# Patient Record
Sex: Male | Born: 1960 | Race: White | Hispanic: No | Smoking: Current every day smoker
Health system: Southern US, Community
[De-identification: ages and names within clinical notes are randomized; demographics above are authoritative.]

## PROBLEM LIST (undated history)

## (undated) ENCOUNTER — Emergency Department (HOSPITAL_COMMUNITY): Admission: EM | Discharge status: Absent without leave | Payer: Self-pay

## (undated) DIAGNOSIS — R569 Unspecified convulsions: Secondary | ICD-10-CM

## (undated) DIAGNOSIS — F1010 Alcohol abuse, uncomplicated: Secondary | ICD-10-CM

## (undated) DIAGNOSIS — F17200 Nicotine dependence, unspecified, uncomplicated: Secondary | ICD-10-CM

## (undated) DIAGNOSIS — I10 Essential (primary) hypertension: Secondary | ICD-10-CM

## (undated) HISTORY — PX: APPENDECTOMY: SHX54

## (undated) HISTORY — DX: Unspecified convulsions: R56.9

---

## 2012-04-02 HISTORY — PX: REPAIR OF ESOPHAGUS: SHX6062

## 2015-09-06 ENCOUNTER — Emergency Department (HOSPITAL_COMMUNITY)
Admission: EM | Admit: 2015-09-06 | Discharge: 2015-09-06 | Disposition: A | Discharge status: Home / Self Care | Payer: Self-pay | Attending: Emergency Medicine | Admitting: Emergency Medicine

## 2015-09-06 ENCOUNTER — Emergency Department (HOSPITAL_COMMUNITY): Payer: Self-pay

## 2015-09-06 ENCOUNTER — Encounter (HOSPITAL_COMMUNITY): Payer: Self-pay

## 2015-09-06 DIAGNOSIS — Y998 Other external cause status: Secondary | ICD-10-CM | POA: Insufficient documentation

## 2015-09-06 DIAGNOSIS — F17210 Nicotine dependence, cigarettes, uncomplicated: Secondary | ICD-10-CM | POA: Insufficient documentation

## 2015-09-06 DIAGNOSIS — R569 Unspecified convulsions: Secondary | ICD-10-CM | POA: Insufficient documentation

## 2015-09-06 DIAGNOSIS — Y9301 Activity, walking, marching and hiking: Secondary | ICD-10-CM | POA: Insufficient documentation

## 2015-09-06 DIAGNOSIS — Z880 Allergy status to penicillin: Secondary | ICD-10-CM | POA: Insufficient documentation

## 2015-09-06 DIAGNOSIS — W1839XA Other fall on same level, initial encounter: Secondary | ICD-10-CM | POA: Insufficient documentation

## 2015-09-06 DIAGNOSIS — Y92009 Unspecified place in unspecified non-institutional (private) residence as the place of occurrence of the external cause: Secondary | ICD-10-CM | POA: Insufficient documentation

## 2015-09-06 DIAGNOSIS — S0990XA Unspecified injury of head, initial encounter: Secondary | ICD-10-CM | POA: Insufficient documentation

## 2015-09-06 DIAGNOSIS — I10 Essential (primary) hypertension: Secondary | ICD-10-CM | POA: Insufficient documentation

## 2015-09-06 HISTORY — DX: Essential (primary) hypertension: I10

## 2015-09-06 LAB — COMPREHENSIVE METABOLIC PANEL
ALBUMIN: 3.2 g/dL — AB (ref 3.5–5.0)
ALK PHOS: 123 U/L (ref 38–126)
ALT: 53 U/L (ref 17–63)
AST: 198 U/L — ABNORMAL HIGH (ref 15–41)
Anion gap: 10 (ref 5–15)
BUN: <5 mg/dL — ABNORMAL LOW (ref 6–20)
CALCIUM: 8.7 mg/dL — AB (ref 8.9–10.3)
CO2: 31 mmol/L (ref 22–32)
CREATININE: 0.89 mg/dL (ref 0.61–1.24)
Chloride: 93 mmol/L — ABNORMAL LOW (ref 101–111)
GFR calc non Af Amer: >60 mL/min (ref >60)
GLUCOSE: 112 mg/dL — AB (ref 65–99)
Potassium: 2.7 mmol/L — CL (ref 3.5–5.1)
SODIUM: 134 mmol/L — AB (ref 135–145)
Total Bilirubin: 1.3 mg/dL — ABNORMAL HIGH (ref 0.3–1.2)
Total Protein: 6.3 g/dL — ABNORMAL LOW (ref 6.5–8.1)

## 2015-09-06 LAB — CBC WITH DIFFERENTIAL/PLATELET
Basophils Absolute: 0 10*3/uL (ref 0.0–0.1)
Basophils Relative: 0 %
EOS ABS: 0 10*3/uL (ref 0.0–0.7)
Eosinophils Relative: 0 %
HEMATOCRIT: 34.5 % — AB (ref 39.0–52.0)
HEMOGLOBIN: 11.7 g/dL — AB (ref 13.0–17.0)
LYMPHS ABS: 0.5 10*3/uL — AB (ref 0.7–4.0)
LYMPHS PCT: 8 %
MCH: 37.9 pg — AB (ref 26.0–34.0)
MCHC: 33.9 g/dL (ref 30.0–36.0)
MCV: 111.7 fL — ABNORMAL HIGH (ref 78.0–100.0)
Monocytes Absolute: 0.6 10*3/uL (ref 0.1–1.0)
Monocytes Relative: 9 %
NEUTROS ABS: 5.7 10*3/uL (ref 1.7–7.7)
NEUTROS PCT: 83 %
Platelets: 95 10*3/uL — ABNORMAL LOW (ref 150–400)
RBC: 3.09 MIL/uL — AB (ref 4.22–5.81)
RDW: 15.3 % (ref 11.5–15.5)
WBC: 6.8 10*3/uL (ref 4.0–10.5)

## 2015-09-06 LAB — ETHANOL: Alcohol, Ethyl (B): <5 mg/dL (ref <5)

## 2015-09-06 LAB — RAPID URINE DRUG SCREEN, HOSP PERFORMED
Amphetamines: NOT DETECTED
Barbiturates: NOT DETECTED
Benzodiazepines: NOT DETECTED
Cocaine: NOT DETECTED
OPIATES: NOT DETECTED
TETRAHYDROCANNABINOL: NOT DETECTED

## 2015-09-06 LAB — CBG MONITORING, ED: Glucose-Capillary: 126 mg/dL — ABNORMAL HIGH (ref 65–99)

## 2015-09-06 MED ORDER — POTASSIUM CHLORIDE CRYS ER 20 MEQ PO TBCR
40.0000 meq | EXTENDED_RELEASE_TABLET | Freq: Once | ORAL | Status: AC
  Administered 2015-09-06: 40 meq via ORAL
  Filled 2015-09-06: qty 2

## 2015-09-06 MED ORDER — POTASSIUM CHLORIDE ER 20 MEQ PO TBCR: 40.0000 meq | EXTENDED_RELEASE_TABLET | Freq: Every day | ORAL | Status: DC

## 2015-09-06 MED ORDER — SODIUM CHLORIDE 0.9 % IV SOLN
1000.0000 mL | Freq: Once | INTRAVENOUS | Status: AC
  Administered 2015-09-06: 1000 mL via INTRAVENOUS

## 2015-09-06 MED ORDER — SODIUM CHLORIDE 0.9 % IV SOLN
1000.0000 mL | INTRAVENOUS | Status: DC
  Administered 2015-09-06: 1000 mL via INTRAVENOUS

## 2015-09-06 MED ORDER — POTASSIUM CHLORIDE 10 MEQ/100ML IV SOLN
10.0000 meq | Freq: Once | INTRAVENOUS | Status: AC
  Administered 2015-09-06: 10 meq via INTRAVENOUS
  Filled 2015-09-06: qty 100

## 2015-09-06 NOTE — ED Provider Notes (Signed)
CSN: [id number]     Arrival date & time 09/06/15  1608 History   First MD Initiated Contact with Patient 09/06/15 1649     Chief Complaint  Patient presents with  . Seizures   Patient is a 55 y.o. male presenting with seizures.  Seizures Seizure activity on arrival: no   Seizure type:  Tonic Preceding symptoms: no dizziness, no euphoria, no headache, no nausea, no numbness, no panic and no vision change   Initial focality:  None Episode characteristics: abnormal movements, confusion, generalized shaking and tongue biting   Episode characteristics: no apnea and no focal shaking   Postictal symptoms: confusion and somnolence   Return to baseline: yes   Severity:  Moderate Duration:  5 minutes Timing:  Once Number of seizures this episode:  1-2 Progression:  Resolved Context: medical non-compliance   Context: not alcohol withdrawal, not drug use, not family hx of seizures, not fever and not previous head injury   Recent head injury:  During the event PTA treatment:  None History of seizures: yes    Past Medical History  Diagnosis Date  . Hypertension    Past Surgical History  Procedure Laterality Date  . Appendectomy    . Repair of esophagus  2014   History reviewed. No pertinent family history. Social History  Substance Use Topics  . Smoking status: Current Every Day Smoker    Types: Cigarettes  . Smokeless tobacco: None  . Alcohol Use: Yes     Comment: use to be heavy drinker    Review of Systems  Allergic/Immunologic: Negative for immunocompromised state.  Neurological: Positive for seizures.  All other systems reviewed and are negative.     Allergies  Penicillins  Home Medications   Prior to Admission medications   Medication Sig Start Date End Date Taking? Authorizing Provider  potassium chloride 20 MEQ TBCR Take 40 mEq by mouth daily. 09/06/15   Sidney AceAlison Charruf Tedd Cottrill, MD   BP 141/101 mmHg  Pulse 107  Temp(Src) 98.8 F (37.1 C) (Oral)  Resp 15  Ht 6'  2" (1.88 m)  Wt 77.111 kg  BMI 21.82 kg/m2  SpO2 97% Physical Exam  Constitutional: He is oriented to person, place, and time. He appears well-developed and well-nourished. No distress.  HENT:  Head: Normocephalic.  Left Ear: External ear normal.  Eyes: Conjunctivae are normal. Pupils are equal, round, and reactive to light. Right eye exhibits no discharge. Left eye exhibits no discharge.  Neck: Normal range of motion. Neck supple.  Cardiovascular: Normal rate and regular rhythm.   No murmur heard. Pulmonary/Chest: Effort normal and breath sounds normal. No respiratory distress.  Abdominal: Soft. Bowel sounds are normal. He exhibits no distension and no mass. There is no tenderness. There is no rebound and no guarding.  Musculoskeletal: He exhibits no edema.  Neurological: He is alert and oriented to person, place, and time. No cranial nerve deficit. Coordination normal.  Skin: Skin is warm. He is not diaphoretic.  Psychiatric: He has a normal mood and affect.  Out of 5 strength in all extremities, normal sensation 4, normal coordination, does have mild tremor, normal gait Nose is swollen and tender but no deformity, no nasal hematoma, tympanic membrane without blood and open wounds. No tenderness of face otherwise, scalp, CT or L-spine, extremities, thorax or chest.  ED Course  Procedures  Labs Review Labs Reviewed  CBC WITH DIFFERENTIAL/PLATELET - Abnormal; Notable for the following:    RBC 3.09 (*)    Hemoglobin 11.7 (*)  HCT 34.5 (*)    MCV 111.7 (*)    MCH 37.9 (*)    Platelets 95 (*)    Lymphs Abs 0.5 (*)    All other components within normal limits  COMPREHENSIVE METABOLIC PANEL - Abnormal; Notable for the following:    Sodium 134 (*)    Potassium 2.7 (*)    Chloride 93 (*)    Glucose, Bld 112 (*)    BUN <5 (*)    Calcium 8.7 (*)    Total Protein 6.3 (*)    Albumin 3.2 (*)    AST 198 (*)    Total Bilirubin 1.3 (*)    All other components within normal limits   CBG MONITORING, ED - Abnormal; Notable for the following:    Glucose-Capillary 126 (*)    All other components within normal limits  URINE RAPID DRUG SCREEN, HOSP PERFORMED  ETHANOL    Imaging Review Ct Head Wo Contrast  09/06/2015  CLINICAL DATA:  Recent seizure activity EXAM: CT HEAD WITHOUT CONTRAST TECHNIQUE: Contiguous axial images were obtained from the base of the skull through the vertex without intravenous contrast. COMPARISON:  None. FINDINGS: Bony calvarium is intact. Mild atrophic changes are seen. No findings to suggest acute hemorrhage, acute infarction or space-occupying mass lesion are noted. IMPRESSION: Mild atrophic changes without acute abnormality. Electronically Signed   By: Alcide Clever M.D.   On: 09/06/2015 18:39   I have personally reviewed and evaluated these images and lab results as part of my medical decision-making.   EKG Interpretation   Date/Time:  Tuesday September 06 2015 16:12:47 EDT Ventricular Rate:  104 PR Interval:  184 QRS Duration: 92 QT Interval:  342 QTC Calculation: 450 R Axis:   74 Text Interpretation:  Sinus tachycardia Atrial premature complex No old  tracing to compare Confirmed by [facility]  MD, DAVID ([id number]) on 09/06/2015  4:23:49 PM      MDM   Final diagnoses:  Seizure [facility])   Patient presents with first seizure. Initially denied any alcohol use but family states they suspect he continues to drink daily as he disappears for long periods of time. Patient interviewed and private did not but subsequently didn't state that he intermittently drank 4 days ago. However he continues to deny daily use. Ethanol is negative here, UDS negative. CT head negative. No evidence of trauma except that the nose but no deviation, difficulty breathing, or nasal hematoma. No obvious laceration that requires Tdap update. Patient's electrolytes unremarkable Except for potassium which was replaced as an IV and oral and one tablet sent home for patient to take. His  labs are concerning for chronic alcohol use. Patient the past has been on a antiseizure medication but he cannot recall the name. Given questionable alcohol abuse versus primary seizure disorder, patient likely requires further evaluation by neurology. We will not start Keppra at this time. Of note, patient does not hear to be acutely withdrawing or in DTs, not tachycardic anymore, no diaphoresis, no nausea or vomiting. Patient states to follow-up as outpatient for further nonemergent evaluation.    Sidney Ace, MD 09/07/15 1914  Dione Booze, MD 09/07/15 [phone]

## 2015-09-06 NOTE — ED Notes (Signed)
Pt. Coming from home via [facility] for a seizure. Pt. At home with family and started tremoring while walking. Pt. Went into a seizure and fell face first on a carpet in the house. Pt. Hx of one seizure before from ETOH withdrawal. Pt. sts he hasn't  Had a drink in 4-6 months. Pt. Does report a lot of stress from divorce recently. Pt. Post ictal when EMS arrived, but is now AOx4. Pt. Has abrasions noted to right knee, left hand, and nose. Pt. Given 4 mg zofran en route for nausea.

## 2015-10-11 ENCOUNTER — Encounter (HOSPITAL_COMMUNITY): Payer: Self-pay | Admitting: *Deleted

## 2015-10-11 ENCOUNTER — Emergency Department (HOSPITAL_COMMUNITY)
Admission: EM | Admit: 2015-10-11 | Discharge: 2015-10-11 | Disposition: A | Discharge status: Home / Self Care | Payer: Self-pay | Attending: Emergency Medicine | Admitting: Emergency Medicine

## 2015-10-11 DIAGNOSIS — R569 Unspecified convulsions: Secondary | ICD-10-CM

## 2015-10-11 DIAGNOSIS — F17210 Nicotine dependence, cigarettes, uncomplicated: Secondary | ICD-10-CM | POA: Insufficient documentation

## 2015-10-11 DIAGNOSIS — Z79899 Other long term (current) drug therapy: Secondary | ICD-10-CM | POA: Insufficient documentation

## 2015-10-11 DIAGNOSIS — I10 Essential (primary) hypertension: Secondary | ICD-10-CM | POA: Insufficient documentation

## 2015-10-11 DIAGNOSIS — F1010 Alcohol abuse, uncomplicated: Secondary | ICD-10-CM | POA: Insufficient documentation

## 2015-10-11 LAB — CBC WITH DIFFERENTIAL/PLATELET
Basophils Absolute: 0.1 10*3/uL (ref 0.0–0.1)
Basophils Relative: 1 %
Eosinophils Absolute: 0.1 10*3/uL (ref 0.0–0.7)
Eosinophils Relative: 2 %
HEMATOCRIT: 41.4 % (ref 39.0–52.0)
HEMOGLOBIN: 14.1 g/dL (ref 13.0–17.0)
Lymphocytes Relative: 38 %
Lymphs Abs: 2.2 10*3/uL (ref 0.7–4.0)
MCH: 36.2 pg — AB (ref 26.0–34.0)
MCHC: 34.1 g/dL (ref 30.0–36.0)
MCV: 106.4 fL — AB (ref 78.0–100.0)
MONO ABS: 0.3 10*3/uL (ref 0.1–1.0)
Monocytes Relative: 6 %
NEUTROS ABS: 3.1 10*3/uL (ref 1.7–7.7)
NEUTROS PCT: 53 %
Platelets: 217 10*3/uL (ref 150–400)
RBC: 3.89 MIL/uL — AB (ref 4.22–5.81)
RDW: 16 % — ABNORMAL HIGH (ref 11.5–15.5)
WBC: 5.7 10*3/uL (ref 4.0–10.5)

## 2015-10-11 LAB — BASIC METABOLIC PANEL
Anion gap: 11 (ref 5–15)
BUN: 7 mg/dL (ref 6–20)
CHLORIDE: 108 mmol/L (ref 101–111)
CO2: 23 mmol/L (ref 22–32)
CREATININE: 0.85 mg/dL (ref 0.61–1.24)
Calcium: 8.6 mg/dL — ABNORMAL LOW (ref 8.9–10.3)
GFR calc Af Amer: >60 mL/min (ref >60)
GFR calc non Af Amer: >60 mL/min (ref >60)
Glucose, Bld: 112 mg/dL — ABNORMAL HIGH (ref 65–99)
POTASSIUM: 3.5 mmol/L (ref 3.5–5.1)
SODIUM: 142 mmol/L (ref 135–145)

## 2015-10-11 LAB — MAGNESIUM: Magnesium: 1.9 mg/dL (ref 1.7–2.4)

## 2015-10-11 LAB — ETHANOL: ALCOHOL ETHYL (B): 423 mg/dL — AB (ref <5)

## 2015-10-11 MED ORDER — LORAZEPAM 1 MG PO TABS: ORAL_TABLET | ORAL | Status: DC

## 2015-10-11 MED ORDER — SODIUM CHLORIDE 0.9 % IV BOLUS (SEPSIS)
1000.0000 mL | Freq: Once | INTRAVENOUS | Status: AC
  Administered 2015-10-11: 1000 mL via INTRAVENOUS

## 2015-10-11 MED ORDER — SODIUM CHLORIDE 0.9 % IV SOLN
1000.0000 mg | Freq: Once | INTRAVENOUS | Status: AC
  Administered 2015-10-11: 1000 mg via INTRAVENOUS
  Filled 2015-10-11: qty 10

## 2015-10-11 MED ORDER — LEVETIRACETAM 500 MG PO TABS: 500.0000 mg | ORAL_TABLET | Freq: Two times a day (BID) | ORAL | Status: DC

## 2015-10-11 NOTE — Discharge Instructions (Signed)
Seizure, Adult A seizure means there is unusual activity in the brain. A seizure can cause changes in attention or behavior. Seizures often cause shaking (convulsions). Seizures often last from 30 seconds to 2 minutes. HOME CARE   If you are given medicines, take them exactly as told by your doctor.  Keep all doctor visits as told.  Do not swim or drive until your doctor says it is okay.  Teach others what to do if you have a seizure. They should:  Lay you on the ground.  Put a cushion under your head.  Loosen any tight clothing around your neck.  Turn you on your side.  Stay with you until you get better. GET HELP RIGHT AWAY IF:   The seizure lasts longer than 2 to 5 minutes.  The seizure is very bad.  The person does not wake up after the seizure.  The person's attention or behavior changes. Drive the person to the emergency room or call your local emergency services (911 in [place] MAKE SURE YOU:   Understand these instructions.  Will watch your condition.  Will get help right away if you are not doing well or get worse.   This information is not intended to replace advice given to you by your health care provider. Make sure you discuss any questions you have with your health care provider.   Document Released: 09/05/2007 Document Revised: 06/11/2011 Document Reviewed: 10/29/2012     Substance Abuse Treatment Programs  Intensive Outpatient Programs [facility] Services     [address]      [place], [place]                   [phone]       [facility] [address] [place], [place] [phone]  [facility]     (Inpatient and outpatient)     [address]           [phone]    [facility] [phone] (Suboxone and Methadone)  [address]      [place], [place] [address]      [phone]       [address] [place],  [place] [phone]  [facility] (Outpatient/Inpatient, [facility])    (insurance only) [phone]             [facility] (Groups & Residential) [place], [place] [phone]     [facility]     [address]     [place], [place]      [phone]       [facility] (for caregivers and family) [phone] [address] [place], [place] [phone]      Residential Treatment Programs [facility]      [address] [place], [place] [address]  [phone]       [facility] [address], [place], [place] [address] [phone]  [facility] [place]        [phone]       [facility] [phone]  [facility] ([facility]             [address]                                         [place], [place]  [phone] or [phone]                               [facility] ([facility] [address] [place], [phone]  [facility]    [address]      [place], [place]     [phone]       [facility] [address] [place], [place] [phone]  [facility]   [address]     [place], [place] [address]     [phone]      Admissions: 8am-3pm M-F  [facility] ([facility]) [address] [place], [place] [phone]  [facility]: Residential Program [phone](90 Days)   [place]      [phone] or [phone]     [facility]: [facility] [place], [place] (Walk in Hours over the weekend or by referral)  [facility] [address] [place], [place], [place] [address] [phone]  Crisis Mobile: [facility]:    [phone] (for crisis response 24 hours a day) [facility] Hotline:      [phone]

## 2015-10-11 NOTE — ED Provider Notes (Signed)
  Blood pressure 116/87, pulse 88, temperature 98.7 F (37.1 C), temperature source Oral, resp. rate 18, SpO2 95 %.  Assuming care from Dr. Radford PaxBeaton.  In short, Karl LimerickSteven A Alvarado is a 55 y.o. male with a chief complaint of Seizures and Alcohol Intoxication .  Refer to the original H&P for additional details.  The current plan of care is to discharge when clinically sober and ambulatory.   10:16 PM Spoke with patient who awakens easily. He is speaking in a normal tone of voice. Appears clinically sober. Will ambulate and discharge. Reviewed the plan and he is comfortable with this.   Alona BeneJoshua Rehema Muffley, MD  Maia PlanJoshua G Zayyan Mullen, MD 10/11/15 2217

## 2015-10-11 NOTE — ED Notes (Signed)
Per EMS report: pt coming from and presents to the ED after a witnessed seizure.  Pt was drinking ETOH with friends and family when pt had a two minute full body seizure.  Pt hx of seizures and is noncompliant with medications.  EMS didn't note any obvious injuries.  No tongue injury or incontinence.

## 2015-10-11 NOTE — ED Notes (Signed)
Pt ambulated in hallway Pt was a little shaky but stayed on his feet

## 2015-10-11 NOTE — ED Provider Notes (Signed)
CSN: [id number]     Arrival date & time 10/11/15  1423 History   First MD Initiated Contact with Patient 10/11/15 1443     Chief Complaint  Patient presents with  . Seizures  . Alcohol Intoxication      HPI  Expand All Collapse All   Per EMS report: pt coming from and presents to the ED after a witnessed seizure. Pt was drinking ETOH with friends and family when pt had a two minute full body seizure. Pt hx of seizures and is noncompliant with medications. EMS didn't note any obvious injuries. No tongue injury or incontinence.        Past Medical History  Diagnosis Date  . Hypertension    Past Surgical History  Procedure Laterality Date  . Appendectomy    . Repair of esophagus  2014   No family history on file. Social History  Substance Use Topics  . Smoking status: Current Every Day Smoker    Types: Cigarettes  . Smokeless tobacco: None  . Alcohol Use: Yes     Comment: use to be heavy drinker    Review of Systems  Unable to perform ROS: Other      Allergies  Penicillins  Home Medications   Prior to Admission medications   Medication Sig Start Date End Date Taking? Authorizing Provider  diphenhydramine-acetaminophen (TYLENOL PM) 25-500 MG TABS tablet Take 1 tablet by mouth at bedtime as needed (sleep).   Yes Historical Provider, MD  levETIRAcetam (KEPPRA) 500 MG tablet Take 1 tablet (500 mg total) by mouth 2 (two) times daily. 10/11/15   Nelva Nayobert Regine Christian, MD  LORazepam (ATIVAN) 1 MG tablet Take 1 mg every 4 hours as needed for withdrawal 10/11/15   Nelva Nayobert Lovelace Cerveny, MD  potassium chloride 20 MEQ TBCR Take 40 mEq by mouth daily. Patient not taking: Reported on 10/11/2015 09/06/15   Sidney AceAlison Charruf Ruch, MD   BP 125/92 mmHg  Pulse 97  Temp(Src) 98.7 F (37.1 C) (Oral)  Resp 18  SpO2 97% Physical Exam Physical Exam  Nursing note and vitals reviewed. Constitutional: Patient is intoxicated and is slurring his words.  Denies any pain to neck or extremities.  Denies  headache.  well-nourished. No distress.  HENT:  Head: Normocephalic and atraumatic.  Eyes: Pupils are equal, round, and reactive to light.  Neck: Normal range of motion.  Cardiovascular: Normal rate and intact distal pulses.   Pulmonary/Chest: No respiratory distress.  Abdominal: Normal appearance. He exhibits no distension.  Musculoskeletal: Normal range of motion.  Neurological: He is alert and oriented to person, place, and time. No cranial nerve deficit.  Skin: Skin is warm and dry. No rash noted.  Psychiatric: He has a normal mood and affect. His behavior is normal.   ED Course  Procedures (including critical care time) Labs Review Labs Reviewed  CBC WITH DIFFERENTIAL/PLATELET - Abnormal; Notable for the following:    RBC 3.89 (*)    MCV 106.4 (*)    MCH 36.2 (*)    RDW 16.0 (*)    All other components within normal limits  BASIC METABOLIC PANEL - Abnormal; Notable for the following:    Glucose, Bld 112 (*)    Calcium 8.6 (*)    All other components within normal limits  ETHANOL - Abnormal; Notable for the following:    Alcohol, Ethyl (B) 423 (*)    All other components within normal limits  MAGNESIUM    Imaging Review No results found. Patient had CT scan of  brain done post seizure On June 6.  No evidence of head trauma and no complaints today.  No need for repeat CT less patient does not have expected recovery after sobering up.  Discussed with neurology.  They recommended 1 g Keppra started on Keppra as outpatient.  Referral to follow-up with neurology and also will be given resources for alcohol abuse.  MDM   Final diagnoses:  Seizure (HCC)  Alcohol abuse        Nelva Nay, MD 10/12/15 1219

## 2015-10-11 NOTE — Progress Notes (Signed)
Pt confirms with CM that [facility]" Pt did not open his eyes but responded to voice and touch (Cm touched him on his left leg)

## 2015-10-26 ENCOUNTER — Ambulatory Visit: Payer: Self-pay | Admitting: Neurology

## 2015-10-26 DIAGNOSIS — Z029 Encounter for administrative examinations, unspecified: Secondary | ICD-10-CM

## 2015-11-02 ENCOUNTER — Ambulatory Visit (INDEPENDENT_AMBULATORY_CARE_PROVIDER_SITE_OTHER): Payer: Self-pay | Admitting: Neurology

## 2015-11-02 ENCOUNTER — Encounter: Payer: Self-pay | Admitting: Neurology

## 2015-11-02 VITALS — BP 140/88 | HR 120 | Ht 74.0 in | Wt 159.0 lb

## 2015-11-02 DIAGNOSIS — F1010 Alcohol abuse, uncomplicated: Secondary | ICD-10-CM

## 2015-11-02 DIAGNOSIS — R569 Unspecified convulsions: Secondary | ICD-10-CM | POA: Insufficient documentation
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
## 2015-11-02 NOTE — Progress Notes (Signed)
Note sent to Dr. Swaziland.

## 2015-11-02 NOTE — Patient Instructions (Signed)
1. Schedule 1-hour sleep-deprived EEG 2. Continue Keppra 500mg  twice a day for now, we will call you with EEG results and further instructions on medications after the test 3. Continue with alcohol cessation 4. As per [place] driving laws, no driving until 6 months seizure-free 5. Follow-up in 1 month, call for any changes  Seizure Precautions: 1. If medication has been prescribed for you to prevent seizures, take it exactly as directed.  Do not stop taking the medicine without talking to your doctor first, even if you have not had a seizure in a long time.   2. Avoid activities in which a seizure would cause danger to yourself or to others.  Don't operate dangerous machinery, swim alone, or climb in high or dangerous places, such as on ladders, roofs, or girders.  Do not drive unless your doctor says you may.  3. If you have any warning that you may have a seizure, lay down in a safe place where you can't hurt yourself.    4.  No driving for 6 months from last seizure, as per [facility].   Please refer to the following link on the [employer]ile[employer] of America's website for more information: http://www.epilepsyfoundation.org/answerplace/Social/driving/drivingu.cfm   5.  Maintain good sleep hygiene. Avoid alcohol.  6.  Contact your doctor if you have any problems that may be related to the medicine you are taking.  7.  Call 911 and bring the patient back to the ED if:        A.  The seizure lasts longer than 5 minutes.       B.  The patient doesn't awaken shortly after the seizure  C.  The patient has new problems such as difficulty seeing, speaking or moving  D.  The patient was injured during the seizure  E.  The patient has a temperature over 102 F (39C)  F.  The patient vomited and now is having trouble breathing

## 2015-11-14 ENCOUNTER — Ambulatory Visit (INDEPENDENT_AMBULATORY_CARE_PROVIDER_SITE_OTHER): Payer: Self-pay | Admitting: Neurology

## 2015-11-14 DIAGNOSIS — R569 Unspecified convulsions: Secondary | ICD-10-CM

## 2015-11-15 NOTE — Procedures (Signed)
ELECTROENCEPHALOGRAM REPORT  Date of Study: 11/14/2015  Patient's Name: Karl Alvarado MRN: [id number] Date of Birth: 05/04/1960  Referring Provider: Dr. Patrcia DollyKaren Aquino  Clinical History: This is a 55 year old man with 4 generalized convulsions that have occurred in the setting of alcohol withdrawal/alcohol intoxication  Medications: Keppra  Technical Summary: A multichannel digital 1-hour sleep-deprived EEG recording measured by the international 10-20 system with electrodes applied with paste and impedances below 5000 ohms performed in our laboratory with EKG monitoring in an awake and drowsy patient.  Hyperventilation and photic stimulation were performed.  The digital EEG was referentially recorded, reformatted, and digitally filtered in a variety of bipolar and referential montages for optimal display.    Description: The patient is awake and drowsy during the recording.  During maximal wakefulness, there is a symmetric, medium voltage 9-9.5 Hz posterior dominant rhythm that attenuates with eye opening.  The record is symmetric.  During drowsiness, there is an increase in theta slowing of the background with central beta activity seen. Deeper stages of sleep were not seen. Hyperventilation and photic stimulation did not elicit any abnormalities.  There were no epileptiform discharges or electrographic seizures seen.    EKG lead was unremarkable.  Impression: This 1-hour awake and drowsy EEG is normal.    Clinical Correlation: A normal EEG does not exclude a clinical diagnosis of epilepsy.  If further clinical questions remain, prolonged EEG may be helpful.  Clinical correlation is advised.   Patrcia DollyKaren Aquino, M.D.

## 2015-11-16 ENCOUNTER — Telehealth: Payer: Self-pay | Admitting: Neurology

## 2015-11-16 NOTE — Telephone Encounter (Signed)
PT stated he was returning your call/Dawn CB#[phone]

## 2015-11-18 ENCOUNTER — Encounter: Payer: Self-pay | Admitting: *Deleted

## 2015-11-18 NOTE — Telephone Encounter (Signed)
Initally called to give pt lab results, I have called and lett vm several times this week (see result notes for time/date), but unsuccessful at reaching the pt.  In consideration of not being able to reach the patient I have mailed the pt a copy of the results with details of medication change per result note.

## 2015-11-30 ENCOUNTER — Ambulatory Visit: Payer: Self-pay | Admitting: Neurology

## 2015-11-30 DIAGNOSIS — Z029 Encounter for administrative examinations, unspecified: Secondary | ICD-10-CM

## 2019-01-30 ENCOUNTER — Encounter (HOSPITAL_COMMUNITY): Payer: Self-pay

## 2019-01-30 ENCOUNTER — Other Ambulatory Visit: Payer: Self-pay

## 2019-01-30 ENCOUNTER — Ambulatory Visit (HOSPITAL_COMMUNITY)
Admission: EM | Admit: 2019-01-30 | Discharge: 2019-01-30 | Disposition: A | Discharge status: Home / Self Care | Payer: Self-pay

## 2019-01-30 DIAGNOSIS — L03115 Cellulitis of right lower limb: Secondary | ICD-10-CM

## 2019-01-30 MED ORDER — CEFTRIAXONE SODIUM 1 G IJ SOLR
1.0000 g | Freq: Once | INTRAMUSCULAR | Status: AC
  Administered 2019-01-30: 1 g via INTRAMUSCULAR

## 2019-01-30 MED ORDER — HYDROCODONE-ACETAMINOPHEN 5-325 MG PO TABS: 1.0000 | ORAL_TABLET | Freq: Four times a day (QID) | ORAL | 0 refills | Status: AC | PRN

## 2019-01-30 MED ORDER — LIDOCAINE-EPINEPHRINE (PF) 2 %-1:200000 IJ SOLN
INTRAMUSCULAR | Status: AC
  Filled 2019-01-30: qty 20

## 2019-01-30 MED ORDER — DOXYCYCLINE HYCLATE 100 MG PO CAPS: 100.0000 mg | ORAL_CAPSULE | Freq: Two times a day (BID) | ORAL | 0 refills | Status: DC

## 2019-01-30 MED ORDER — CEFTRIAXONE SODIUM 1 G IJ SOLR
INTRAMUSCULAR | Status: AC
  Filled 2019-01-30: qty 10

## 2019-01-30 NOTE — Discharge Instructions (Addendum)
Treating you for infection of the right leg.  Take the medication as prescribed. We will ask you to come back here in 2 days for recheck Hydrocodone as needed for pain If your symptoms worsen over the weekend to include more severe swelling, redness or pain you need to go to the ER

## 2019-01-30 NOTE — ED Provider Notes (Signed)
[facility]    CSN: [id number] Arrival date & time: 01/30/19  [id number]      History   Chief Complaint Chief Complaint  Patient presents with  . Leg Pain  . Leg Swelling    HPI Karl LimerickSteven A Rochon is a 58 y.o. male.   Pt is a 58 year old male with past medical history of hypertension and seizures.  He presents today with right leg pain, swelling, redness.  Reporting that he cut his right lower extremity on a metal kettle pot approximately 2 weeks ago.  Woke up Monday of this week with pain in leg and then 2 days ago started developing significant swelling in the right foot and lower extremity.  Very painful to walk. No fever, chills, body aches, nausea, vomiting.  Has been elevating the leg.  ROS per HPI      Past Medical History:  Diagnosis Date  . Hypertension   . Seizures [facility](HCC)     Patient Active Problem List   Diagnosis Date Noted  . 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 Alcohol abuse 11/02/2015    Past Surgical History:  Procedure Laterality Date  . APPENDECTOMY    . REPAIR OF ESOPHAGUS  2014       Home Medications    Prior to Admission medications   Medication Sig Start Date End Date Taking? Authorizing Provider  ibuprofen (ADVIL) 200 MG tablet Take 200 mg by mouth every 6 (six) hours as needed.   Yes [provider]  diphenhydramine-acetaminophen (TYLENOL PM) 25-500 MG TABS tablet Take 1 tablet by mouth at bedtime as needed (sleep).    [provider]  doxycycline (VIBRAMYCIN) 100 MG capsule Take 1 capsule (100 mg total) by mouth 2 (two) times daily. 01/30/19   Dahlia ByesBast, Albino Bufford A, NP  HYDROcodone-acetaminophen (NORCO/VICODIN) 5-325 MG tablet Take 1-2 tablets by mouth every 6 (six) hours as needed for up to 3 days. 01/30/19 02/02/19  Dahlia ByesBast, Tyronne Blann A, NP  levETIRAcetam (KEPPRA) 500 MG tablet Take 1 tablet (500 mg total) by mouth 2 (two) times daily. 10/11/15   Nelva NayBeaton, Robert, MD    Family History Family History  Problem Relation Age of Onset  .  Prostate cancer Father     Social History Social History   Tobacco Use  . Smoking status: Current Every Day Smoker    Packs/day: 0.50    Types: Cigarettes  . Smokeless tobacco: Never Used  Substance Use Topics  . Alcohol use: No    Comment: used to be a heavy drinker  . Drug use: No     Allergies   Penicillins   Review of Systems Review of Systems   Physical Exam Triage Vital Signs ED Triage Vitals  Enc Vitals Group     BP 01/30/19 1002 (!) 133/100     Pulse Rate 01/30/19 1002 82     Resp 01/30/19 1002 16     Temp 01/30/19 1002 98.5 F (36.9 C)     Temp Source 01/30/19 1002 Oral     SpO2 01/30/19 1002 100 %     Weight --      Height --      Head Circumference --      Peak Flow --      Pain Score 01/30/19 0957 7     Pain Loc --      Pain Edu? --      Excl. in GC? --    No data found.  Updated Vital Signs BP (!) 133/100 (BP Location:  Right Arm)   Pulse 82   Temp 98.5 F (36.9 C) (Oral)   Resp 16   SpO2 100%   Visual Acuity Right Eye Distance:   Left Eye Distance:   Bilateral Distance:    Right Eye Near:   Left Eye Near:    Bilateral Near:     Physical Exam Vitals signs and nursing note reviewed.  Constitutional:      General: He is not in acute distress.    Appearance: Normal appearance. He is not ill-appearing, toxic-appearing or diaphoretic.  HENT:     Head: Normocephalic and atraumatic.     Nose: Nose normal.  Eyes:     Conjunctiva/sclera: Conjunctivae normal.  Neck:     Musculoskeletal: Normal range of motion.  Pulmonary:     Effort: Pulmonary effort is normal.  Musculoskeletal: Normal range of motion.        General: Swelling and tenderness present.     Right lower leg: Edema present.     Comments: See picture for detail Unable to palpate pedal pulse due to edema but was able to Doppler 1+ pitting edema to lower extremity with obvious discoloration in the right leg compared to left leg. Warm to touch  Skin:    General: Skin is  warm and dry.     Findings: Erythema present.  Neurological:     Mental Status: He is alert.  Psychiatric:        Mood and Affect: Mood normal.          UC Treatments / Results  Labs (all labs ordered are listed, but only abnormal results are displayed) Labs Reviewed - No data to display  EKG   Radiology No results found.  Procedures Procedures (including critical care time)  Medications Ordered in UC Medications  cefTRIAXone (ROCEPHIN) injection 1 g (1 g Intramuscular Given 01/30/19 1042)  lidocaine-EPINEPHrine (XYLOCAINE W/EPI) 2 %-1:200000 (PF) injection (has no administration in time range)  cefTRIAXone (ROCEPHIN) 1 g injection (has no administration in time range)    Initial Impression / Assessment and Plan / UC Course  I have reviewed the triage vital signs and the nursing notes.  Pertinent labs & imaging results that were available during my care of the patient were reviewed by me and considered in my medical decision making (see chart for details).     Cellulitis-treating with Rocephin injection here in clinic today and sending home with doxycycline to take twice a day for the next 10 days. Recommended follow-up here in 48 hours for recheck Also recommended that if symptoms worsen over the next 48 hours to include more severe redness, swelling or pain he will need to go to the ER. Hydrocodone given for pain Patient reporting tetanus up-to-date Patient understanding and agree to plan Final Clinical Impressions(s) / UC Diagnoses   Final diagnoses:  Cellulitis of right lower extremity     Discharge Instructions     Treating you for infection of the right leg.  Take the medication as prescribed. We will ask you to come back here in 2 days for recheck Hydrocodone as needed for pain If your symptoms worsen over the weekend to include more severe swelling, redness or pain you need to go to the ER     ED Prescriptions    Medication Sig Dispense Auth.  Provider   doxycycline (VIBRAMYCIN) 100 MG capsule Take 1 capsule (100 mg total) by mouth 2 (two) times daily. 20 capsule Keauna Brasel A, NP   HYDROcodone-acetaminophen (NORCO/VICODIN)  5-325 MG tablet Take 1-2 tablets by mouth every 6 (six) hours as needed for up to 3 days. 10 tablet Lashawn Orrego A, NP     I have reviewed the PDMP during this encounter.   Loura Halt A, NP 01/30/19 1049

## 2019-01-30 NOTE — ED Triage Notes (Signed)
Pt states he woke up 3 days ago with pain in his right leg. Pt states 2 days ago he started developing swelling in his right leg. Pt states he scratched hsi skin 2 week ago with a metal piece. Pt took Advil yesterday and helped with the pain.

## 2019-02-02 ENCOUNTER — Encounter (HOSPITAL_COMMUNITY): Payer: Self-pay

## 2019-02-02 ENCOUNTER — Ambulatory Visit (HOSPITAL_COMMUNITY)
Admission: EM | Admit: 2019-02-02 | Discharge: 2019-02-02 | Disposition: A | Discharge status: Home / Self Care | Payer: Self-pay | Attending: Family Medicine | Admitting: Family Medicine

## 2019-02-02 ENCOUNTER — Other Ambulatory Visit: Payer: Self-pay

## 2019-02-02 ENCOUNTER — Ambulatory Visit (HOSPITAL_COMMUNITY)
Admission: RE | Admit: 2019-02-02 | Discharge: 2019-02-02 | Disposition: A | Discharge status: Home / Self Care | Payer: Self-pay | Source: Ambulatory Visit | Attending: Family Medicine | Admitting: Family Medicine

## 2019-02-02 ENCOUNTER — Encounter (HOSPITAL_COMMUNITY): Payer: Self-pay | Admitting: Emergency Medicine

## 2019-02-02 ENCOUNTER — Emergency Department (HOSPITAL_COMMUNITY)
Admission: EM | Admit: 2019-02-02 | Discharge: 2019-02-02 | Disposition: A | Discharge status: Home / Self Care | Payer: Self-pay | Attending: Emergency Medicine | Admitting: Emergency Medicine

## 2019-02-02 DIAGNOSIS — L03115 Cellulitis of right lower limb: Secondary | ICD-10-CM

## 2019-02-02 DIAGNOSIS — I10 Essential (primary) hypertension: Secondary | ICD-10-CM | POA: Insufficient documentation

## 2019-02-02 DIAGNOSIS — M79651 Pain in right thigh: Secondary | ICD-10-CM

## 2019-02-02 DIAGNOSIS — M7989 Other specified soft tissue disorders: Secondary | ICD-10-CM | POA: Insufficient documentation

## 2019-02-02 DIAGNOSIS — Z87891 Personal history of nicotine dependence: Secondary | ICD-10-CM | POA: Insufficient documentation

## 2019-02-02 DIAGNOSIS — M79604 Pain in right leg: Secondary | ICD-10-CM | POA: Insufficient documentation

## 2019-02-02 DIAGNOSIS — R52 Pain, unspecified: Secondary | ICD-10-CM

## 2019-02-02 DIAGNOSIS — R936 Abnormal findings on diagnostic imaging of limbs: Secondary | ICD-10-CM | POA: Insufficient documentation

## 2019-02-02 DIAGNOSIS — Z7901 Long term (current) use of anticoagulants: Secondary | ICD-10-CM | POA: Insufficient documentation

## 2019-02-02 DIAGNOSIS — I82411 Acute embolism and thrombosis of right femoral vein: Secondary | ICD-10-CM | POA: Insufficient documentation

## 2019-02-02 LAB — CBC
HCT: 36.6 % — ABNORMAL LOW (ref 39.0–52.0)
Hemoglobin: 11.3 g/dL — ABNORMAL LOW (ref 13.0–17.0)
MCH: 26.3 pg (ref 26.0–34.0)
MCHC: 30.9 g/dL (ref 30.0–36.0)
MCV: 85.3 fL (ref 80.0–100.0)
Platelets: 236 10*3/uL (ref 150–400)
RBC: 4.29 MIL/uL (ref 4.22–5.81)
RDW: 22.8 % — ABNORMAL HIGH (ref 11.5–15.5)
WBC: 8.7 10*3/uL (ref 4.0–10.5)
nRBC: 0 % (ref 0.0–0.2)

## 2019-02-02 LAB — BASIC METABOLIC PANEL
Anion gap: 9 (ref 5–15)
BUN: 7 mg/dL (ref 6–20)
CO2: 27 mmol/L (ref 22–32)
Calcium: 8.6 mg/dL — ABNORMAL LOW (ref 8.9–10.3)
Chloride: 99 mmol/L (ref 98–111)
Creatinine, Ser: 0.85 mg/dL (ref 0.61–1.24)
GFR calc Af Amer: >60 mL/min (ref >60)
GFR calc non Af Amer: >60 mL/min (ref >60)
Glucose, Bld: 121 mg/dL — ABNORMAL HIGH (ref 70–99)
Potassium: 3.6 mmol/L (ref 3.5–5.1)
Sodium: 135 mmol/L (ref 135–145)

## 2019-02-02 MED ORDER — APIXABAN 5 MG PO TABS
10.0000 mg | ORAL_TABLET | Freq: Two times a day (BID) | ORAL | Status: DC
  Administered 2019-02-02: 19:00:00 10 mg via ORAL
  Filled 2019-02-02: qty 2

## 2019-02-02 MED ORDER — APIXABAN 5 MG PO TABS: ORAL_TABLET | ORAL | 0 refills | Status: AC

## 2019-02-02 MED ORDER — APIXABAN 5 MG PO TABS: 5.0000 mg | ORAL_TABLET | Freq: Two times a day (BID) | ORAL | Status: DC

## 2019-02-02 NOTE — ED Notes (Signed)
Patient verbalizes understanding of discharge instructions. Opportunity for questioning and answers were provided. Armband removed by staff, pt discharged from ED.  

## 2019-02-02 NOTE — ED Triage Notes (Signed)
Pt present for follow up. Pt states the swelling in hir right leg is the same as 3 days ago when he was seen at the UC. Pt states the pain in his right leg improved.  Pt states is painful to walk, especially if he walk up the stairs.

## 2019-02-02 NOTE — ED Provider Notes (Signed)
[facility] EMERGENCY DEPARTMENT Provider Note   CSN: [id number] Arrival date & time: 02/02/19  1610     History   Chief Complaint Chief Complaint  Patient presents with   Leg Swelling    DVT    HPI Karl Alvarado is a 58 y.o. male.     HPI 58 year old male comes in a chief complaint of confirmed DVT.  He reports that 3 weeks ago he injured his right foot by striking it on furniture.  Subsequently he started noticing some pain in his entire right leg.  More recently started having swelling in the leg and he saw a primary care doctor who ordered an ultrasound that confirmed a DVT.  Pt has no hx of PE, DVT and denies any exogenous hormone (testosterone / estrogen) use, long distance travels or surgery in the past 6 weeks, active cancer, recent immobilization.  He denies any family history of clotting disorder.  It seems that he has occasional binge drinking but does not have any known underlying liver problems.  Patient has no chest pain, shortness of breath, dizziness.  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 Authorizing Provider  apixaban (ELIQUIS) 5 MG TABS tablet Take 2 tablets ( ) twice daily for 7 days, then 1 tablet ( ) twice daily 02/02/19   Derwood Kaplan, MD  diphenhydramine-acetaminophen (TYLENOL PM) 25-500 MG TABS tablet Take 1 tablet by mouth at bedtime as needed (sleep).    [provider]  doxycycline (VIBRAMYCIN) 100 MG capsule Take 1 capsule (100 mg total) by mouth 2 (two) times daily. 01/30/19   Dahlia Byes A, NP  HYDROcodone-acetaminophen (NORCO/VICODIN) 5-325 MG  tablet Take 1-2 tablets by mouth every 6 (six) hours as needed for up to 3 days. 01/30/19 02/02/19  Dahlia Byes A, NP  levETIRAcetam (KEPPRA) 500 MG tablet Take 1 tablet (500 mg total) by mouth 2 (two) times daily. 10/11/15   Nelva Nay, MD    Family History Family History  Problem Relation Age of Onset   Prostate cancer Father     Social History Social History   Tobacco Use   Smoking status: Current Every Day Smoker    Packs/day: 0.00    Types: Cigars   Smokeless tobacco: Never Used  Substance Use Topics   Alcohol use: No    Comment: used to be a heavy drinker   Drug use: No     Allergies   Penicillins   Review of Systems Review of Systems  Constitutional: Positive for activity change.  Respiratory: Negative for shortness of breath.   Cardiovascular: Negative for chest pain.  Musculoskeletal: Positive for myalgias.  Neurological: Negative for syncope and light-headedness.  All other systems reviewed and are negative.    Physical Exam Updated Vital Signs BP (!) 160/104 (BP Location: Right Arm)    Pulse 74    Temp 98 F (36.7 C) (Oral)    Resp 15    Ht  (1.88 m)    Wt 72.6 kg    SpO2 100%    BMI 20.54  kg/m   Physical Exam Vitals signs and nursing note reviewed.  Constitutional:      Appearance: He is well-developed.  HENT:     Head: Atraumatic.  Neck:     Musculoskeletal: Neck supple.  Cardiovascular:     Rate and Rhythm: Normal rate.  Pulmonary:     Effort: Pulmonary effort is normal.  Musculoskeletal:     Right lower leg: Edema present.     Left lower leg: No edema.     Comments: Patient is significant right lower extremity edema with tenderness over the calf region.  2+ dorsalis pedis appreciated.  Skin:    General: Skin is warm.  Neurological:     Mental Status: He is alert and oriented to person, place, and time.      ED Treatments / Results  Labs (all labs ordered are listed, but only abnormal results are displayed) Labs  Reviewed  BASIC METABOLIC PANEL - Abnormal; Notable for the following components:      Result Value   Glucose, Bld 121 (*)    Calcium 8.6 (*)    All other components within normal limits  CBC - Abnormal; Notable for the following components:   Hemoglobin 11.3 (*)    HCT 36.6 (*)    RDW 22.8 (*)    All other components within normal limits    EKG None  Radiology Vas [place] Lower Extremity Venous (dvt)  Result Date: 02/02/2019  Lower Venous Study Indications: Pain, and Swelling.  Comparison Study: no prior Performing Technologist: Jeb Levering RDMS, RVT  Examination Guidelines: A complete evaluation includes B-mode imaging, spectral Doppler, color Doppler, and power Doppler as needed of all accessible portions of each vessel. Bilateral testing is considered an integral part of a complete examination. Limited examinations for reoccurring indications may be performed as noted.  +---------+---------------+---------+-----------+----------+-------------------+  RIGHT     Compressibility Phasicity Spontaneity Properties Thrombus Aging       +---------+---------------+---------+-----------+----------+-------------------+  CFV       None            No        No                                          +---------+---------------+---------+-----------+----------+-------------------+  SFJ       None                                                                  +---------+---------------+---------+-----------+----------+-------------------+  FV Prox   None                                                                  +---------+---------------+---------+-----------+----------+-------------------+  FV Mid    None                                                                  +---------+---------------+---------+-----------+----------+-------------------+  FV Distal None                                                                   +---------+---------------+---------+-----------+----------+-------------------+  PFV       Full                                                                  +---------+---------------+---------+-----------+----------+-------------------+  POP       None            No        No                                          +---------+---------------+---------+-----------+----------+-------------------+  PTV       Full                                                                  +---------+---------------+---------+-----------+----------+-------------------+  PERO      Full                                                                  +---------+---------------+---------+-----------+----------+-------------------+  GSV       None                                             extending from prox                                                              calf to groin        +---------+---------------+---------+-----------+----------+-------------------+  Ex iliac                  Yes       Yes                                         +---------+---------------+---------+-----------+----------+-------------------+   +----+---------------+---------+-----------+----------+--------------+  LEFT Compressibility Phasicity Spontaneity Properties Thrombus Aging  +----+---------------+---------+-----------+----------+--------------+  CFV  Full            Yes       Yes                                    +----+---------------+---------+-----------+----------+--------------+  Summary: Right: Findings consistent with acute deep vein thrombosis involving the right common femoral vein, right femoral vein, and right popliteal vein. Findings consistent with acute superficial vein thrombosis involving the right great saphenous vein. A cystic structure is found in the popliteal fossa. Left: No evidence of common femoral vein obstruction.  *See table(s) above for measurements and observations.    Preliminary      Procedures Procedures (including critical care time)  Medications Ordered in ED Medications  apixaban (ELIQUIS) tablet 10 mg (has no administration in time range)    Followed by  apixaban (ELIQUIS) tablet 5 mg (has no administration in time range)     Initial Impression / Assessment and Plan / ED Course  I have reviewed the triage vital signs and the nursing notes.  Pertinent labs & imaging results that were available during my care of the patient were reviewed by me and considered in my medical decision making (see chart for details).        58 year old comes in a chief complaint of blood clot in his leg. It is unclear what provoked the clot, perhaps the injury few days ago might have initiated the cascade.  Either way, he is got DVT in the femoral and popliteal veins.  Vein is compressible in the iliacs and he has no chest pain, shortness of breath.   We will have him follow-up with heme-onc.  He will be started on Eliquis and pharmacy will provide education on it.  Strict ER return precautions discussed with the patient and his family.  He will return to the ER if he starts having any chest pain, shortness of breath, palpitations, dizziness or near fainting.  Final Clinical Impressions(s) / ED Diagnoses   Final diagnoses:  Acute deep vein thrombosis (DVT) of femoral vein of right lower extremity [facility])    ED Discharge Orders         Ordered    apixaban (ELIQUIS) 5 MG TABS tablet     02/02/19 1825           Varney Biles, MD 02/02/19 1831

## 2019-02-02 NOTE — ED Triage Notes (Addendum)
Pt states he developed swelling and pain in his right lower leg on Wednesday of last week. Pt went to primary MD and was given antibiotics. It has not improved. Pt had DVT study today which showed DVT.

## 2019-02-02 NOTE — ED Provider Notes (Signed)
[place]    CSN: [id number] Arrival date & time: 02/02/19  1422      History   Chief Complaint Chief Complaint  Patient presents with  . Follow-up    HPI Karl Alvarado is a 58 y.o. male.   58 year old man who comes in for follow-up of wound he suffered about 2 weeks ago on his right calf.  He says that the redness is no longer there and he has no pain in his calf.  He has been taking his medicine as directed.  Patient does have continued swelling which he is concerned about in his right calf.  He also notices a linear distribution of tender pain in his medial right calf.  Patient had no shortness of breath.     Past Medical History:  Diagnosis Date  . Hypertension   . Seizures [facility])     Patient Active Problem List   Diagnosis Date Noted  . Convulsions ([place]) 11/02/2015  . Alcohol abuse 11/02/2015    Past Surgical History:  Procedure Laterality Date  . APPENDECTOMY    . REPAIR OF ESOPHAGUS  2014       Home Medications    Prior to Admission medications   Medication Sig Start Date End Date Taking? Authorizing Provider  diphenhydramine-acetaminophen (TYLENOL PM) 25-500 MG TABS tablet Take 1 tablet by mouth at bedtime as needed (sleep).    [provider]  doxycycline (VIBRAMYCIN) 100 MG capsule Take 1 capsule (100 mg total) by mouth 2 (two) times daily. 01/30/19   Loura Halt A, NP  HYDROcodone-acetaminophen (NORCO/VICODIN) 5-325 MG tablet Take 1-2 tablets by mouth every 6 (six) hours as needed for up to 3 days. 01/30/19 02/02/19  Loura Halt A, NP  ibuprofen (ADVIL) 200 MG tablet Take 200 mg by mouth every 6 (six) hours as needed.    [provider]  levETIRAcetam (KEPPRA) 500 MG tablet Take 1 tablet (500 mg total) by mouth 2 (two) times daily. 10/11/15   Leonard Schwartz, MD    Family History Family History  Problem Relation Age of Onset  . Prostate cancer Father     Social History Social History   Tobacco Use  . Smoking  status: Current Every Day Smoker    Packs/day: 0.50    Types: Cigarettes  . Smokeless tobacco: Never Used  Substance Use Topics  . Alcohol use: No    Comment: used to be a heavy drinker  . Drug use: No     Allergies   Penicillins   Review of Systems Review of Systems   Physical Exam Triage Vital Signs ED Triage Vitals  Enc Vitals Group     BP 02/02/19 1450 (!) 142/90     Pulse Rate 02/02/19 1450 74     Resp 02/02/19 1450 15     Temp 02/02/19 1450 98.5 F (36.9 C)     Temp src --      SpO2 02/02/19 1450 100 %     Weight --      Height --      Head Circumference --      Peak Flow --      Pain Score 02/02/19 1445 4     Pain Loc --      Pain Edu? --      Excl. in [place]? --    No data found.  Updated Vital Signs BP (!) 142/90 (BP Location: Right Arm)   Pulse 74   Temp 98.5 F (36.9 C)  Resp 15   SpO2 100%    Physical Exam Vitals signs and nursing note reviewed.  Constitutional:      General: He is not in acute distress.    Appearance: Normal appearance. He is normal weight.  HENT:     Head: Normocephalic.  Eyes:     Conjunctiva/sclera: Conjunctivae normal.  Neck:     Musculoskeletal: Normal range of motion.  Cardiovascular:     Rate and Rhythm: Normal rate.  Pulmonary:     Effort: Pulmonary effort is normal.  Musculoskeletal: Normal range of motion.        General: Swelling and tenderness present.     Comments: Tender along the distribution of the right great saphenous vein.  Edematous right calf  Skin:    General: Skin is warm and dry.  Neurological:     General: No focal deficit present.     Mental Status: He is alert.  Psychiatric:        Mood and Affect: Mood normal.        UC Treatments / Results  Labs (all labs ordered are listed, but only abnormal results are displayed) Labs Reviewed - No data to display  EKG   Radiology No results found.  Procedures Procedures (including critical care time)  Medications Ordered in UC  Medications - No data to display  Initial Impression / Assessment and Plan / UC Course  I have reviewed the triage vital signs and the nursing notes.  Pertinent labs & imaging results that were available during my care of the patient were reviewed by me and considered in my medical decision making (see chart for details).    Final Clinical Impressions(s) / UC Diagnoses   Final diagnoses:  Cellulitis of leg, right  Right thigh pain     Discharge Instructions     I am concerned that you may have a clot in the large, right saphenous vein, which can be a serious problem.  The wound is healing well    ED Prescriptions    None     PDMP not reviewed this encounter.   Elvina Sidle, MD 02/02/19 1520

## 2019-02-02 NOTE — Progress Notes (Signed)
[place] for Apixaban Indication: DVT  Allergies  Allergen Reactions  . Penicillins Hives and Rash    Rash  Has patient had a PCN reaction causing immediate rash, facial/tongue/throat swelling, SOB or lightheadedness with hypotension: YES Has patient had a PCN reaction causing severe rash involving mucus membranes or skin necrosis: NO Has patient had a PCN reaction that required hospitalizationNO Has patient had a PCN reaction occurring within the last 10 years: NO If all of the above answers are "NO", then may proceed with Cephalosporin use.    Patient Measurements: Height: 6\' 2"  (188 cm) Weight: 160 lb (72.6 kg) IBW/kg (Calculated) : 82.2 Heparin Dosing Weight: 72.6 kg  Vital Signs: Temp: 98 F (36.7 C) (11/02 1617) Temp Source: Oral (11/02 1617) BP: 160/104 (11/02 1617) Pulse Rate: 74 (11/02 1617)  Labs: Recent Labs    02/02/19 1641  HGB 11.3*  HCT 36.6*  PLT 236  CREATININE 0.85    Estimated Creatinine Clearance: 99.6 mL/min (by C-G formula based on SCr of 0.85 mg/dL).   Medical History: Past Medical History:  Diagnosis Date  . Hypertension   . Seizures (HCC)     Medications:  Scheduled:  . apixaban  10 mg Oral BID   Followed by  . [START ON 02/09/2019] apixaban  5 mg Oral BID    Assessment: 76 yom with new DVT found in the ED. Pharmacy requested to dose Apixaban.  Goal of Therapy:   Monitor platelets by anticoagulation protocol: Yes   Plan:  - Apixaban 10 mg PO BID x 7 days  - Followed by Apixaban 5 mg PO BID - Coupon care provided to the patient and educations was completed.   Duanne Limerick PharmD. BCPS  02/02/2019,6:26 PM

## 2019-02-02 NOTE — Discharge Instructions (Signed)
We signed the ER for swelling in your leg which appears to be because of a blood clot in your leg.  Please start taking the prescribed blood thinner.  Read the instructions provided.  Return to the ER immediately if you start having chest pain, shortness of breath, near fainting.  See your primary doctor in 2 weeks  Information on my medicine - ELIQUIS (apixaban)  Why was Eliquis prescribed for you? Eliquis was prescribed to treat blood clots that may have been found in the veins of your legs (deep vein thrombosis) or in your lungs (pulmonary embolism) and to reduce the risk of them occurring again.  What do You need to know about Eliquis ? The starting dose is 10 mg (two 5 mg tablets) taken TWICE daily for the FIRST SEVEN (7) DAYS, then on  02/09/2019 FOR THE EVENING DOSE  the dose is reduced to ONE 5 mg tablet taken TWICE daily.  Eliquis may be taken with or without food.   Try to take the dose about the same time in the morning and in the evening. If you have difficulty swallowing the tablet whole please discuss with your pharmacist how to take the medication safely.  Take Eliquis exactly as prescribed and DO NOT stop taking Eliquis without talking to the doctor who prescribed the medication.  Stopping may increase your risk of developing a new blood clot.  Refill your prescription before you run out.  After discharge, you should have regular check-up appointments with your healthcare provider that is prescribing your Eliquis.    What do you do if you miss a dose? If a dose of ELIQUIS is not taken at the scheduled time, take it as soon as possible on the same day and twice-daily administration should be resumed. The dose should not be doubled to make up for a missed dose.  Important Safety Information A possible side effect of Eliquis is bleeding. You should call your healthcare provider right away if you experience any of the following: ? Bleeding from an injury or your nose  that does not stop. ? Unusual colored urine (red or dark brown) or unusual colored stools (red or black). ? Unusual bruising for unknown reasons. ? A serious fall or if you hit your head (even if there is no bleeding).  Some medicines may interact with Eliquis and might increase your risk of bleeding or clotting while on Eliquis. To help avoid this, consult your healthcare provider or pharmacist prior to using any new prescription or non-prescription medications, including herbals, vitamins, non-steroidal anti-inflammatory drugs (NSAIDs) and supplements.  This website has more information on Eliquis (apixaban): http://www.eliquis.com/eliquis/home

## 2019-02-02 NOTE — Discharge Instructions (Addendum)
I am concerned that you may have a clot in the large, right saphenous vein, which can be a serious problem.  The wound is healing well

## 2019-02-02 NOTE — Progress Notes (Signed)
RLE venous duplex       has been completed. Preliminary results can be found under CV proc through chart review. June Leap, BS, RDMS, RVT   Called results to Dr. Joseph Art. Patient instructed to go to ED immediately to be seen for treatment options.

## 2019-02-06 ENCOUNTER — Telehealth: Payer: Self-pay | Admitting: Hematology and Oncology

## 2019-02-06 NOTE — Telephone Encounter (Signed)
Lft vm to schedule a hem appt for Mr. Kiener.

## 2019-02-16 ENCOUNTER — Telehealth: Payer: Self-pay | Admitting: Hematology and Oncology

## 2019-02-16 NOTE — Telephone Encounter (Signed)
I received a msg from Dr. Lindi Adie to schedule Mr. Karl Alvarado with any provider for DVT. Mr. Karl Alvarado returned my call from 11/6 to schedule a new hem appt. He has been scheduled to see Dr. Lorenso Courier on 11/24 at [place]. Pt has been made aware to arrive 15 minutes early.

## 2019-02-16 NOTE — Telephone Encounter (Signed)
Pt returned my call to schedule an appt, but his phone disconnected. I cld back and lft a vm for the pt to return my call.

## 2019-02-23 NOTE — Progress Notes (Signed)
[facility] [phone]   [phone]  INITIAL CONSULT NOTE  Patient Care Team: [place], Julie M, NP as PCP - General (Nurse Practitioner)  Hematological/Oncological History # Deep Vein Thrombosis  1)02/02/2019: presented to the ED with right lower extremity swelling. Found to have acute deep vein thrombosis involving the right common femoral vein, right femoral vein, and right popliteal vein and findings consistent with acute superficial vein thrombosis involving the right great saphenous vein on [place] LE. Thought to be provoked in setting of foot injury. Started on apixaban therapy. 2) 02/23/2019: establish care with Dr. Leonides Schanz   CHIEF COMPLAINTS/PURPOSE OF CONSULTATION:  Right Lower Extremity Deep Vein Thrombosis   HISTORY OF PRESENTING ILLNESS:  Karl Alvarado 58 y.o. male with medical history significant for HTN and seizure disorder who presents for evaluation of a newly diagnosed lower extremity DVT.   On review of the records, Karl Alvarado presented to the ED on 02/02/2019 with swelling of his right leg. He struck his foot moving furniture 3 weeks prior to presenting. He notes the pain eventually spread to the entire leg. When he arrived at the ED he underwent a bilateral LE doppler which revealed acute deep vein thrombosis involving the right common femoral vein, right femoral vein, and right popliteal vein. He was started on apixaban therapy and d/c with hematology clinic f/u.  On exam today Karl Alvarado notes that the pain in his leg has resolved.  The erythema and swelling have also reduced significantly, however there is some residual edema in the lower extremity.  On further discussion he notes that on October 24 he tripped over a pot holder on his deck and developed a small wound which bled very little.  Over the following week the leg became pink and more swollen and was concerning to him.  He initially presented to an urgent care where he was treated with  antibiotic therapy and when this failed to resolve and the pain extended up to his thigh and ultrasound was ordered.  After the DVT was discovered he was started on Eliquis therapy which she has tolerated well.  He notes that he has no bleeding bruising or dark stools.  He also reports that he was given the first month supply the medication free but he is unsure if he will be able to continue for the prescription.  Regarding the patient's anemia he notes that he has lost approximately 35 pounds over the last 3 years with the majority of that weight being lost in the first year.  He notes during this time, he has been divorced, moved in with his parents, and lost his job.  He was also fitted for dentures in March which likely decreased his consumption of meat.  He notes he smokes cigars occasionally approximately 2/day but not every day.  He used to drink alcohol moderately but quit 3 years ago.  Once again he denies any overt signs of bleeding.  A full 10 point ROS is listed below.  MEDICAL HISTORY:  Past Medical History:  Diagnosis Date   Hypertension    Seizures (HCC)     SURGICAL HISTORY: Past Surgical History:  Procedure Laterality Date   APPENDECTOMY     REPAIR OF ESOPHAGUS  2014    SOCIAL HISTORY: Social History   Socioeconomic History   Marital status: Single    Spouse name: Not on file   Number of children: Not on file   Years of education: Not on file  Highest education level: Not on file  Occupational History   Not on file  Social Needs   Financial resource strain: Not on file   Food insecurity    Worry: Not on file    Inability: Not on file   Transportation needs    Medical: Not on file    Non-medical: Not on file  Tobacco Use   Smoking status: Current Every Day Smoker    Packs/day: 0.00    Types: Cigars   Smokeless tobacco: Never Used  Substance and Sexual Activity   Alcohol use: No    Comment: used to be a heavy drinker   Drug use: No    Sexual activity: Not on file  Lifestyle   Physical activity    Days per week: Not on file    Minutes per session: Not on file   Stress: Not on file  Relationships   Social connections    Talks on phone: Not on file    Gets together: Not on file    Attends religious service: Not on file    Active member of club or organization: Not on file    Attends meetings of clubs or organizations: Not on file    Relationship status: Not on file   Intimate partner violence    Fear of current or ex partner: Not on file    Emotionally abused: Not on file    Physically abused: Not on file    Forced sexual activity: Not on file  Other Topics Concern   Not on file  Social History Narrative   Not on file    FAMILY HISTORY: Family History  Problem Relation Age of Onset   Prostate cancer Father     ALLERGIES:  is allergic to penicillins.  MEDICATIONS:  Current Outpatient Medications  Medication Sig Dispense Refill   apixaban (ELIQUIS) 5 MG TABS tablet Take 2 tablets ( ) twice daily for 7 days, then 1 tablet ( ) twice daily 60 tablet 0   diphenhydramine-acetaminophen (TYLENOL PM) 25-500 MG TABS tablet Take 1 tablet by mouth at bedtime as needed (sleep).     No current facility-administered medications for this visit.     REVIEW OF SYSTEMS:   Constitutional: ( - ) fevers, ( - )  chills , ( - ) night sweats Eyes: ( - ) blurriness of vision, ( - ) double vision, ( - ) watery eyes Ears, nose, mouth, throat, and face: ( - ) mucositis, ( - ) sore throat Respiratory: ( - ) cough, ( - ) dyspnea, ( - ) wheezes Cardiovascular: ( - ) palpitation, ( - ) chest discomfort, ( - ) lower extremity swelling Gastrointestinal:  ( - ) nausea, ( - ) heartburn, ( - ) change in bowel habits Skin: ( - ) abnormal skin rashes Lymphatics: ( - ) new lymphadenopathy, ( - ) easy bruising Neurological: ( - ) numbness, ( - ) tingling, ( - ) new weaknesses Behavioral/Psych: ( - ) mood change, ( - ) new  changes  All other systems were reviewed with the patient and are negative.  PHYSICAL EXAMINATION: ECOG PERFORMANCE STATUS: 0 - Asymptomatic  Vitals:   02/24/19 0922  BP: 104/73  Pulse: (!) 124  Resp: 18  Temp: 98.2 F (36.8 C)  SpO2: 100%   Filed Weights   02/24/19 0922  Weight: 146 lb 9.6 oz (66.5 kg)    GENERAL: well appearing middle aged beared Caucasian male in NAD  SKIN: skin color, texture, turgor are normal,  no rashes or significant lesions EYES: conjunctiva are pink and non-injected, sclera clear LUNGS: clear to auscultation and percussion with normal breathing effort HEART: regular rate & rhythm and no murmurs and no lower extremity edema ABDOMEN: soft, non-tender, non-distended, normal bowel sounds Musculoskeletal: no cyanosis of digits and no clubbing  PSYCH: alert & oriented x 3, fluent speech NEURO: no focal motor/sensory deficits  LABORATORY DATA:  I have reviewed the data as listed Recent Results (from the past 2160 hour(s))  Basic metabolic panel     Status: Abnormal   Collection Time: 02/02/19  4:41 PM  Result Value Ref Range   Sodium 135 135 - 145 mmol/L   Potassium 3.6 3.5 - 5.1 mmol/L   Chloride 99 98 - 111 mmol/L   CO2 27 22 - 32 mmol/L   Glucose, Bld 121 (H) 70 - 99 mg/dL   BUN 7 6 - 20 mg/dL   Creatinine, Ser 0.85 0.61 - 1.24 mg/dL   Calcium 8.6 (L) 8.9 - 10.3 mg/dL   GFR calc non Af Amer >60 >60 mL/min   GFR calc Af Amer >60 >60 mL/min   Anion gap 9 5 - 15    Comment: Performed at [facility], [address] [place], [place] [address]  CBC     Status: Abnormal   Collection Time: 02/02/19  4:41 PM  Result Value Ref Range   WBC 8.7 4.0 - 10.5 K/uL   RBC 4.29 4.22 - 5.81 MIL/uL   Hemoglobin 11.3 (L) 13.0 - 17.0 g/dL   HCT 36.6 (L) 39.0 - 52.0 %   MCV 85.3 80.0 - 100.0 fL   MCH 26.3 26.0 - 34.0 pg   MCHC 30.9 30.0 - 36.0 g/dL   RDW 22.8 (H) 11.5 - 15.5 %   Platelets 236 150 - 400 K/uL   nRBC 0.0 0.0 - 0.2 %    Comment:  Performed at [facility] [address] [place], [place] [address]    PATHOLOGY: None to review  RADIOGRAPHIC STUDIES:  Vas [place] Lower Extremity Venous (dvt)  Result Date: 02/03/2019  Lower Venous Study Indications: Pain, and Swelling.  Comparison Study: no prior Performing Technologist: June Leap RDMS, RVT  Examination Guidelines: A complete evaluation includes B-mode imaging, spectral Doppler, color Doppler, and power Doppler as needed of all accessible portions of each vessel. Bilateral testing is considered an integral part of a complete examination. Limited examinations for reoccurring indications may be performed as noted.  +---------+---------------+---------+-----------+----------+-------------------+  RIGHT     Compressibility Phasicity Spontaneity Properties Thrombus Aging       +---------+---------------+---------+-----------+----------+-------------------+  CFV       None            No        No                                          +---------+---------------+---------+-----------+----------+-------------------+  SFJ       None                                                                  +---------+---------------+---------+-----------+----------+-------------------+  FV Prox   None                                                                  +---------+---------------+---------+-----------+----------+-------------------+  FV Mid    None                                                                  +---------+---------------+---------+-----------+----------+-------------------+  FV Distal None                                                                  +---------+---------------+---------+-----------+----------+-------------------+  PFV       Full                                                                  +---------+---------------+---------+-----------+----------+-------------------+  POP       None            No        No                                           +---------+---------------+---------+-----------+----------+-------------------+  PTV       Full                                                                  +---------+---------------+---------+-----------+----------+-------------------+  PERO      Full                                                                  +---------+---------------+---------+-----------+----------+-------------------+  GSV       None                                             extending from prox                                                              calf to groin        +---------+---------------+---------+-----------+----------+-------------------+  Ex iliac                  Yes       Yes                                         +---------+---------------+---------+-----------+----------+-------------------+   +----+---------------+---------+-----------+----------+--------------+  LEFT Compressibility Phasicity Spontaneity Properties Thrombus Aging  +----+---------------+---------+-----------+----------+--------------+  CFV  Full            Yes       Yes                                    +----+---------------+---------+-----------+----------+--------------+     Summary: Right: Findings consistent with acute deep vein thrombosis involving the right common femoral vein, right femoral vein, and right popliteal vein. Findings consistent with acute superficial vein thrombosis involving the right great saphenous vein. A cystic structure is found in the popliteal fossa. Left: No evidence of common femoral vein obstruction.  *See table(s) above for measurements and observations. Electronically signed by Fabienne Brunsharles Fields MD on 02/03/2019 at 9:41:08 AM.    Final     ASSESSMENT & PLAN Karl Alvarado 58 y.o. male with medical history significant for HTN and seizure disorder who presents for evaluation of a newly diagnosed lower extremity DVT.  After review of the history and discussion with the patient it is clear that this was a  provoked DVT in the setting of cellulitis of the lower extremity.  This was all initiated by an injury to his right leg when bumping into furniture on his porch.  It is likely that the inflammation from the infection was the likely triggering factor for the development of this blood clot.  As a result I would only recommend 3 to 6 months of anticoagulation therapy.  No hypercoagulable work-up is required at this time given this is a single provoked thromboembolism.  We will reassess Karl Alvarado in 2 months time to determine if he needs to continue the therapy.  Of note Karl Alvarado is at increased risk of recurrent VTE given that he has had one.  He has no prior episodes of VTE.  Once therapy is discontinued we will strongly recommend return precautions for any lower extremity swelling, chest pain or shortness of breath.  #Right Lower Extremity Deep Vein Thrombosis, Provoked --continue apixaban 5mg  BID for anticoagulation therapy. Recommend continuation for 3-6 months  --discussed other anticoagulation therapy options including Xarelto and coumadin. After discussion Karl Alvarado decided to continue on apixaban therapy. Noted that we have financial counselors who can assist with findings resources to cover the medication.  --strict return precautions for bleeding/bruising/dark stools or chest pain/shortness of breath  --no indications for hypercoagulation workup in the setting of a single provoked clot and no strong family history  --RTC in approximately 2 months (Feb 2021) to re-evaluate and determine if he should d/c therapy at the 3 month mark.    Orders Placed This Encounter  Procedures   CBC with Differential (Cancer Center Only)    Standing Status:   Future    Standing Expiration Date:   02/24/2020   Retic Panel    Standing Status:   Future    Standing Expiration Date:   02/24/2020   CMP (Cancer Center only)    Standing Status:   Future    Standing Expiration Date:   02/24/2020   Iron and TIBC     Standing Status:   Future    Standing Expiration Date:   02/24/2020   Ferritin    Standing Status:   Future    Standing Expiration Date:   02/24/2020   All questions were answered. The patient knows to call the clinic with any problems, questions or concerns.  A  total of more than 60 minutes were spent face-to-face with the patient during this encounter and over half of that time was spent on counseling and coordination of care as outlined above.   Ulysees Barns, MD Department of Hematology/Oncology [facility] Phone: [phone] Pager: [phone] Email: [email or web address]  02/24/2019 10:28 AM

## 2019-02-24 ENCOUNTER — Inpatient Hospital Stay: Payer: Self-pay | Attending: Hematology and Oncology | Admitting: Hematology and Oncology

## 2019-02-24 ENCOUNTER — Other Ambulatory Visit: Payer: Self-pay

## 2019-02-24 ENCOUNTER — Telehealth: Payer: Self-pay | Admitting: *Deleted

## 2019-02-24 ENCOUNTER — Encounter: Payer: Self-pay | Admitting: Hematology and Oncology

## 2019-02-24 ENCOUNTER — Inpatient Hospital Stay: Payer: Self-pay

## 2019-02-24 VITALS — BP 104/73 | HR 124 | Temp 98.2°F | Resp 18 | Ht 74.0 in | Wt 146.6 lb

## 2019-02-24 DIAGNOSIS — Z23 Encounter for immunization: Secondary | ICD-10-CM

## 2019-02-24 DIAGNOSIS — I82411 Acute embolism and thrombosis of right femoral vein: Secondary | ICD-10-CM

## 2019-02-24 DIAGNOSIS — I10 Essential (primary) hypertension: Secondary | ICD-10-CM

## 2019-02-24 DIAGNOSIS — I82431 Acute embolism and thrombosis of right popliteal vein: Secondary | ICD-10-CM

## 2019-02-24 DIAGNOSIS — D649 Anemia, unspecified: Secondary | ICD-10-CM

## 2019-02-24 DIAGNOSIS — Z7901 Long term (current) use of anticoagulants: Secondary | ICD-10-CM

## 2019-02-24 DIAGNOSIS — Z8042 Family history of malignant neoplasm of prostate: Secondary | ICD-10-CM

## 2019-02-24 DIAGNOSIS — F17210 Nicotine dependence, cigarettes, uncomplicated: Secondary | ICD-10-CM

## 2019-02-24 DIAGNOSIS — G40909 Epilepsy, unspecified, not intractable, without status epilepticus: Secondary | ICD-10-CM

## 2019-02-24 LAB — CMP (CANCER CENTER ONLY)
ALT: 840 U/L (ref 0–44)
AST: 1025 U/L (ref 15–41)
Albumin: 2.9 g/dL — ABNORMAL LOW (ref 3.5–5.0)
Alkaline Phosphatase: 132 U/L — ABNORMAL HIGH (ref 38–126)
Anion gap: 10 (ref 5–15)
BUN: 6 mg/dL (ref 6–20)
CO2: 28 mmol/L (ref 22–32)
Calcium: 8.3 mg/dL — ABNORMAL LOW (ref 8.9–10.3)
Chloride: 100 mmol/L (ref 98–111)
Creatinine: 0.78 mg/dL (ref 0.61–1.24)
GFR, Est AFR Am: >60 mL/min (ref >60)
GFR, Estimated: >60 mL/min (ref >60)
Glucose, Bld: 137 mg/dL — ABNORMAL HIGH (ref 70–99)
Potassium: 3.2 mmol/L — ABNORMAL LOW (ref 3.5–5.1)
Sodium: 138 mmol/L (ref 135–145)
Total Bilirubin: 2.5 mg/dL — ABNORMAL HIGH (ref 0.3–1.2)
Total Protein: 6.5 g/dL (ref 6.5–8.1)

## 2019-02-24 LAB — CBC WITH DIFFERENTIAL (CANCER CENTER ONLY)
Abs Immature Granulocytes: 0.05 10*3/uL (ref 0.00–0.07)
Basophils Absolute: 0.1 10*3/uL (ref 0.0–0.1)
Basophils Relative: 1 %
Eosinophils Absolute: 0.4 10*3/uL (ref 0.0–0.5)
Eosinophils Relative: 8 %
HCT: 37.2 % — ABNORMAL LOW (ref 39.0–52.0)
Hemoglobin: 11.5 g/dL — ABNORMAL LOW (ref 13.0–17.0)
Immature Granulocytes: 1 %
Lymphocytes Relative: 18 %
Lymphs Abs: 0.9 10*3/uL (ref 0.7–4.0)
MCH: 26.3 pg (ref 26.0–34.0)
MCHC: 30.9 g/dL (ref 30.0–36.0)
MCV: 84.9 fL (ref 80.0–100.0)
Monocytes Absolute: 0.7 10*3/uL (ref 0.1–1.0)
Monocytes Relative: 14 %
Neutro Abs: 2.8 10*3/uL (ref 1.7–7.7)
Neutrophils Relative %: 58 %
Platelet Count: 122 10*3/uL — ABNORMAL LOW (ref 150–400)
RBC: 4.38 MIL/uL (ref 4.22–5.81)
RDW: 22.5 % — ABNORMAL HIGH (ref 11.5–15.5)
WBC Count: 4.9 10*3/uL (ref 4.0–10.5)
nRBC: 0 % (ref 0.0–0.2)

## 2019-02-24 LAB — IRON AND TIBC
Iron: 48 ug/dL (ref 42–163)
Saturation Ratios: 20 % (ref 20–55)
TIBC: 241 ug/dL (ref 202–409)
UIBC: 193 ug/dL (ref 117–376)

## 2019-02-24 LAB — RETIC PANEL
Immature Retic Fract: 15.3 % (ref 2.3–15.9)
RBC.: 4.37 MIL/uL (ref 4.22–5.81)
Retic Count, Absolute: 28 10*3/uL (ref 19.0–186.0)
Retic Ct Pct: 0.6 % (ref 0.4–3.1)
Reticulocyte Hemoglobin: 33.2 pg (ref >27.9)

## 2019-02-24 LAB — FERRITIN: Ferritin: 285 ng/mL (ref 24–336)

## 2019-02-24 MED ORDER — INFLUENZA VAC SPLIT QUAD 0.5 ML IM SUSY
0.5000 mL | PREFILLED_SYRINGE | Freq: Once | INTRAMUSCULAR | Status: AC
  Administered 2019-02-24: 0.5 mL via INTRAMUSCULAR

## 2019-02-24 MED ORDER — APIXABAN 5 MG PO TABS: 5.0000 mg | ORAL_TABLET | Freq: Two times a day (BID) | ORAL | 2 refills | Status: AC

## 2019-02-24 MED ORDER — INFLUENZA VAC SPLIT QUAD 0.5 ML IM SUSY
PREFILLED_SYRINGE | INTRAMUSCULAR | Status: AC
  Filled 2019-02-24: qty 0.5

## 2019-02-24 NOTE — Telephone Encounter (Signed)
TCT patient regarding critical LFT results. Per Dr. Lorenso Courier, patient needs to go directly to ED for eval for liver inflammation/failure. Spoke with patient. Explained results of his LFT to him and informed him that he needs to go to ED today for further eval and work up. Pt reluctant at first. Advised that this could be a potentially life threatening situation and it could not/should not be put off at all. Pt states he had something to do right now but would go to the ED by 5 pm this afternoon. Advised that Dr. Lorenso Courier would call the ED this afternoon to advise them of his pending arrival. Reinforced with patient that this was a very serious situation.  Pt voiced understanding

## 2019-02-24 NOTE — ED Notes (Signed)
Per Dr Lorenso Courier from the cancer center

## 2019-02-25 ENCOUNTER — Telehealth: Payer: Self-pay | Admitting: Hematology and Oncology

## 2019-02-25 ENCOUNTER — Telehealth: Payer: Self-pay | Admitting: *Deleted

## 2019-02-25 NOTE — Telephone Encounter (Signed)
TCT patient regarding the need for him to go to ED due to extremely elevated LFT's. Spoke with patient yesterday and impressed upon him the urgent need for him to go to ED yesterday. He said he would go after 5pm. As of this time, patient has not gone to ED at either [facility] ED or [facility]. Dr. Lorenso Courier attempted to call patient this morning-no answer. He left patient a voice mail message encouraging to go to ED. Call made to patient again this afternoon. No answer but voice mail, again, left for patient urging him to go to ED.  TCT to patients mother, Novella Rob (his emergency contact) No answer on her home, no vm available.  Attempted call on her cell #. No answer and voicemail mail box is full.    Dr. Lorenso Courier aware.

## 2019-02-25 NOTE — Telephone Encounter (Signed)
Called Mr. Findling to discuss his labs from yesterday. Nurse called him yesterday requested he go immediately to the ED for evaluation of his markedly elevated LFTs. His findings are concerning for an acute hepatitis and he requires immediate evaluation. The patient declined at the time, noting he would go to the ED later.  I did not receive an answer when I called. I left a message stressing the urgency with which he should get evaluated at a local ED (though preferably [facility] or [facility]).  My nurse attempted a 3rd call today as well with no answer.  At this point I think we have made as many attempts as is reasonable to get him to seek medical attention. I certainly hope he follows our advice and gets the urgent evaluation this requires.  Ledell Peoples, MD Department of Hematology/Oncology [place] [facility] Phone: [phone] Pager: [phone] Email: [email or web address]

## 2019-03-12 ENCOUNTER — Telehealth: Payer: Self-pay | Admitting: *Deleted

## 2019-03-12 NOTE — Telephone Encounter (Signed)
Patient called to inquire about his refill for Eliquis.  Advised he had already sent a refill in, patient was not aware.  He states to relay to Dr. Lorenso Courier that the he no longer has any pain and the swelling is very minimal and he will continue the Eliquis.

## 2019-03-31 ENCOUNTER — Telehealth: Payer: Self-pay | Admitting: Hematology and Oncology

## 2019-03-31 NOTE — Telephone Encounter (Signed)
Scheduled per 11/24 los. Called and left VM. Mailing printout  

## 2019-04-24 NOTE — Progress Notes (Deleted)
[facility] [phone]   Fax:([phone]  PROGRESS NOTE  Patient Care Team: [place], Julie M, NP as PCP - General (Nurse Practitioner)  Hematological/Oncological History # ***  Interval History:  Karl Alvarado 59 y.o. male with medical history significant for *** presents for a follow up visit. The patient's last visit was on ***. In the interim since the last visit ***  MEDICAL HISTORY:  Past Medical History:  Diagnosis Date  . Hypertension   . Seizures (HCC)     SURGICAL HISTORY: Past Surgical History:  Procedure Laterality Date  . APPENDECTOMY    . REPAIR OF ESOPHAGUS  2014    SOCIAL HISTORY: Social History   Socioeconomic History  . Marital status: Single    Spouse name: Not on file  . Number of children: 2  . Years of education: Not on file  . Highest education level: Not on file  Occupational History  . Occupation: Unemployed  Tobacco Use  . Smoking status: Current Every Day Smoker    Packs/day: 0.00    Types: Cigars  . Smokeless tobacco: Never Used  Substance and Sexual Activity  . Alcohol use: No    Comment: used to be a heavy drinker  . Drug use: No  . Sexual activity: Not on file  Other Topics Concern  . Not on file  Social History Narrative  . Not on file   Social Determinants of Health   Financial Resource Strain:   . Difficulty of Paying Living Expenses: Not on file  Food Insecurity:   . Worried About Programme researcher, broadcasting/film/video in the Last Year: Not on file  . Ran Out of Food in the Last Year: Not on file  Transportation Needs:   . Lack of Transportation (Medical): Not on file  . Lack of Transportation (Non-Medical): Not on file  Physical Activity:   . Days of Exercise per Week: Not on file  . Minutes of Exercise per Session: Not on file  Stress:   . Feeling of Stress : Not on file  Social Connections:   . Frequency of Communication with Friends and Family: Not on file  . Frequency of Social Gatherings with  Friends and Family: Not on file  . Attends Religious Services: Not on file  . Active Member of Clubs or Organizations: Not on file  . Attends Banker Meetings: Not on file  . Marital Status: Not on file  Intimate Partner Violence:   . Fear of Current or Ex-Partner: Not on file  . Emotionally Abused: Not on file  . Physically Abused: Not on file  . Sexually Abused: Not on file    FAMILY HISTORY: Family History  Problem Relation Age of Onset  . Diabetes Mother   . Prostate cancer Father     ALLERGIES:  is allergic to penicillins.  MEDICATIONS:  Current Outpatient Medications  Medication Sig Dispense Refill  . apixaban (ELIQUIS) 5 MG TABS tablet Take 2 tablets (10mg ) twice daily for 7 days, then 1 tablet (5mg ) twice daily 60 tablet 0  . apixaban (ELIQUIS) 5 MG TABS tablet Take 1 tablet (5 mg total) by mouth 2 (two) times daily. 60 tablet 2  . diphenhydramine-acetaminophen (TYLENOL PM) 25-500 MG TABS tablet Take 1 tablet by mouth at bedtime as needed (sleep).     No current facility-administered medications for this visit.    REVIEW OF SYSTEMS:   Constitutional: ( - ) fevers, ( - )  chills , ( - )  night sweats Eyes: ( - ) blurriness of vision, ( - ) double vision, ( - ) watery eyes Ears, nose, mouth, throat, and face: ( - ) mucositis, ( - ) sore throat Respiratory: ( - ) cough, ( - ) dyspnea, ( - ) wheezes Cardiovascular: ( - ) palpitation, ( - ) chest discomfort, ( - ) lower extremity swelling Gastrointestinal:  ( - ) nausea, ( - ) heartburn, ( - ) change in bowel habits Skin: ( - ) abnormal skin rashes Lymphatics: ( - ) new lymphadenopathy, ( - ) easy bruising Neurological: ( - ) numbness, ( - ) tingling, ( - ) new weaknesses Behavioral/Psych: ( - ) mood change, ( - ) new changes  All other systems were reviewed with the patient and are negative.  PHYSICAL EXAMINATION: ECOG PERFORMANCE STATUS: {CHL ONC ECOG [id number]}  There were no vitals filed for this  visit. There were no vitals filed for this visit.  GENERAL: alert, no distress and comfortable SKIN: skin color, texture, turgor are normal, no rashes or significant lesions EYES: conjunctiva are pink and non-injected, sclera clear OROPHARYNX: no exudate, no erythema; lips, buccal mucosa, and tongue normal  NECK: supple, non-tender LYMPH:  no palpable lymphadenopathy in the cervical, axillary or inguinal LUNGS: clear to auscultation and percussion with normal breathing effort HEART: regular rate & rhythm and no murmurs and no lower extremity edema ABDOMEN: soft, non-tender, non-distended, normal bowel sounds Musculoskeletal: no cyanosis of digits and no clubbing  PSYCH: alert & oriented x 3, fluent speech NEURO: no focal motor/sensory deficits  LABORATORY DATA:  I have reviewed the data as listed CBC Latest Ref Rng & Units 02/24/2019 02/02/2019 10/11/2015  WBC 4.0 - 10.5 K/uL 4.9 8.7 5.7  Hemoglobin 13.0 - 17.0 g/dL 11.5(L) 11.3(L) 14.1  Hematocrit 39.0 - 52.0 % 37.2(L) 36.6(L) 41.4  Platelets 150 - 400 K/uL 122(L) 236 217    CMP Latest Ref Rng & Units 02/24/2019 02/02/2019 10/11/2015  Glucose 70 - 99 mg/dL 161(W) 960(A) 540(J)  BUN 6 - 20 mg/dL 6 7 7   Creatinine 0.61 - 1.24 mg/dL 8.11 9.14  Sodium 135 - 145 mmol/L 138 135 142  Potassium 3.5 - 5.1 mmol/L 3.2(L) 3.6 3.5  Chloride 98 - 111 mmol/L 100 99 108  CO2 22 - 32 mmol/L 28 27 23   Calcium 8.9 - 10.3 mg/dL 8.3(L) 8.6(L) 8.6(L)  Total Protein 6.5 - 8.1 g/dL 6.5 - -  Total Bilirubin 0.3 - 1.2 mg/dL 2.5(H) - -  Alkaline Phos 38 - 126 U/L 132(H) - -  AST 15 - 41 U/L 1,025(HH) - -  ALT 0 - 44 U/L 840(HH) - -     BLOOD FILM: *** Review of the peripheral blood smear showed normal appearing white cells with neutrophils that were appropriately lobated and granulated. There was no predominance of bi-lobed or hyper-segmented neutrophils appreciated. No Dohle bodies were noted. There was no left shifting, immature forms or blasts  noted. Lymphocytes remain normal in size without any predominance of large granular lymphocytes. Red cells show no anisopoikilocytosis, macrocytes , microcytes or polychromasia. There were no schistocytes, target cells, echinocytes, acanthocytes, dacrocytes, or stomatocytes.There was no rouleaux formation, nucleated red cells, or intra-cellular inclusions noted. The platelets are normal in size, shape, and color without any clumping evident.  RADIOGRAPHIC STUDIES: I have personally reviewed the radiological images as listed and agreed with the findings in the report. No results found.  ASSESSMENT & PLAN ***  No orders of the defined types  were placed in this encounter.   All questions were answered. The patient knows to call the clinic with any problems, questions or concerns.  A total of more than {CHL ONC TIME VISIT - [id number]} were spent on this encounter and over half of that time was spent on counseling and coordination of care as outlined above.   Ledell Peoples, MD Department of Hematology/Oncology [place] [facility] Phone: [phone] Pager: [phone] Email: [email or web address]  04/24/2019 3:45 PM

## 2019-04-27 ENCOUNTER — Inpatient Hospital Stay: Payer: Self-pay | Attending: Hematology and Oncology | Admitting: Hematology and Oncology

## 2019-04-27 ENCOUNTER — Other Ambulatory Visit: Payer: Self-pay

## 2019-04-27 ENCOUNTER — Telehealth: Payer: Self-pay | Admitting: *Deleted

## 2019-04-27 NOTE — Telephone Encounter (Signed)
TCT patient this morning as he did not show up for his scheduled appt.  No answer but was able to leave vm message for pt to return call. TCT patient's mother. No answer on her home phone or her cell phone. No vm available.

## 2019-11-07 ENCOUNTER — Encounter (HOSPITAL_COMMUNITY): Admission: EM | Disposition: E | Payer: Self-pay | Source: Home / Self Care | Attending: Cardiovascular Disease

## 2019-11-07 ENCOUNTER — Emergency Department (HOSPITAL_COMMUNITY): Payer: Self-pay

## 2019-11-07 ENCOUNTER — Encounter (HOSPITAL_COMMUNITY): Payer: Self-pay | Admitting: Emergency Medicine

## 2019-11-07 ENCOUNTER — Inpatient Hospital Stay (HOSPITAL_COMMUNITY)
Admission: EM | Admit: 2019-11-07 | Discharge: 2019-12-02 | DRG: 270 | Disposition: E | Payer: Self-pay | Attending: Internal Medicine | Admitting: Internal Medicine

## 2019-11-07 DIAGNOSIS — I2102 ST elevation (STEMI) myocardial infarction involving left anterior descending coronary artery: Secondary | ICD-10-CM | POA: Diagnosis present

## 2019-11-07 DIAGNOSIS — Z9049 Acquired absence of other specified parts of digestive tract: Secondary | ICD-10-CM

## 2019-11-07 DIAGNOSIS — Z79899 Other long term (current) drug therapy: Secondary | ICD-10-CM

## 2019-11-07 DIAGNOSIS — I2510 Atherosclerotic heart disease of native coronary artery without angina pectoris: Secondary | ICD-10-CM

## 2019-11-07 DIAGNOSIS — Z4509 Encounter for adjustment and management of other cardiac device: Secondary | ICD-10-CM

## 2019-11-07 DIAGNOSIS — Z86718 Personal history of other venous thrombosis and embolism: Secondary | ICD-10-CM

## 2019-11-07 DIAGNOSIS — I2119 ST elevation (STEMI) myocardial infarction involving other coronary artery of inferior wall: Principal | ICD-10-CM

## 2019-11-07 DIAGNOSIS — E872 Acidosis: Secondary | ICD-10-CM | POA: Diagnosis not present

## 2019-11-07 DIAGNOSIS — I110 Hypertensive heart disease with heart failure: Secondary | ICD-10-CM | POA: Diagnosis present

## 2019-11-07 DIAGNOSIS — Z01818 Encounter for other preprocedural examination: Secondary | ICD-10-CM

## 2019-11-07 DIAGNOSIS — A4101 Sepsis due to Methicillin susceptible Staphylococcus aureus: Secondary | ICD-10-CM | POA: Diagnosis present

## 2019-11-07 DIAGNOSIS — I5021 Acute systolic (congestive) heart failure: Secondary | ICD-10-CM | POA: Diagnosis present

## 2019-11-07 DIAGNOSIS — R509 Fever, unspecified: Secondary | ICD-10-CM

## 2019-11-07 DIAGNOSIS — R34 Anuria and oliguria: Secondary | ICD-10-CM | POA: Diagnosis present

## 2019-11-07 DIAGNOSIS — K7460 Unspecified cirrhosis of liver: Secondary | ICD-10-CM | POA: Diagnosis present

## 2019-11-07 DIAGNOSIS — J9600 Acute respiratory failure, unspecified whether with hypoxia or hypercapnia: Secondary | ICD-10-CM

## 2019-11-07 DIAGNOSIS — W19XXXA Unspecified fall, initial encounter: Secondary | ICD-10-CM | POA: Diagnosis present

## 2019-11-07 DIAGNOSIS — G9341 Metabolic encephalopathy: Secondary | ICD-10-CM | POA: Diagnosis not present

## 2019-11-07 DIAGNOSIS — A419 Sepsis, unspecified organism: Secondary | ICD-10-CM | POA: Diagnosis present

## 2019-11-07 DIAGNOSIS — R570 Cardiogenic shock: Secondary | ICD-10-CM | POA: Diagnosis present

## 2019-11-07 DIAGNOSIS — D696 Thrombocytopenia, unspecified: Secondary | ICD-10-CM | POA: Diagnosis present

## 2019-11-07 DIAGNOSIS — Y92009 Unspecified place in unspecified non-institutional (private) residence as the place of occurrence of the external cause: Secondary | ICD-10-CM

## 2019-11-07 DIAGNOSIS — F17210 Nicotine dependence, cigarettes, uncomplicated: Secondary | ICD-10-CM | POA: Diagnosis present

## 2019-11-07 DIAGNOSIS — Z8042 Family history of malignant neoplasm of prostate: Secondary | ICD-10-CM

## 2019-11-07 DIAGNOSIS — D689 Coagulation defect, unspecified: Secondary | ICD-10-CM | POA: Diagnosis present

## 2019-11-07 DIAGNOSIS — R7881 Bacteremia: Secondary | ICD-10-CM | POA: Diagnosis present

## 2019-11-07 DIAGNOSIS — I213 ST elevation (STEMI) myocardial infarction of unspecified site: Secondary | ICD-10-CM | POA: Diagnosis present

## 2019-11-07 DIAGNOSIS — Z20822 Contact with and (suspected) exposure to covid-19: Secondary | ICD-10-CM | POA: Diagnosis present

## 2019-11-07 DIAGNOSIS — Z880 Allergy status to penicillin: Secondary | ICD-10-CM

## 2019-11-07 DIAGNOSIS — Z978 Presence of other specified devices: Secondary | ICD-10-CM

## 2019-11-07 DIAGNOSIS — Z9889 Other specified postprocedural states: Secondary | ICD-10-CM

## 2019-11-07 DIAGNOSIS — J9601 Acute respiratory failure with hypoxia: Secondary | ICD-10-CM | POA: Diagnosis not present

## 2019-11-07 DIAGNOSIS — F1010 Alcohol abuse, uncomplicated: Secondary | ICD-10-CM | POA: Diagnosis present

## 2019-11-07 DIAGNOSIS — F17290 Nicotine dependence, other tobacco product, uncomplicated: Secondary | ICD-10-CM | POA: Diagnosis present

## 2019-11-07 DIAGNOSIS — Z833 Family history of diabetes mellitus: Secondary | ICD-10-CM

## 2019-11-07 DIAGNOSIS — Z4659 Encounter for fitting and adjustment of other gastrointestinal appliance and device: Secondary | ICD-10-CM

## 2019-11-07 DIAGNOSIS — R6521 Severe sepsis with septic shock: Secondary | ICD-10-CM | POA: Diagnosis present

## 2019-11-07 DIAGNOSIS — I330 Acute and subacute infective endocarditis: Secondary | ICD-10-CM | POA: Diagnosis present

## 2019-11-07 DIAGNOSIS — Z7901 Long term (current) use of anticoagulants: Secondary | ICD-10-CM

## 2019-11-07 DIAGNOSIS — B9562 Methicillin resistant Staphylococcus aureus infection as the cause of diseases classified elsewhere: Secondary | ICD-10-CM | POA: Diagnosis present

## 2019-11-07 DIAGNOSIS — D649 Anemia, unspecified: Secondary | ICD-10-CM | POA: Diagnosis present

## 2019-11-07 DIAGNOSIS — N179 Acute kidney failure, unspecified: Secondary | ICD-10-CM | POA: Diagnosis present

## 2019-11-07 DIAGNOSIS — Z452 Encounter for adjustment and management of vascular access device: Secondary | ICD-10-CM

## 2019-11-07 DIAGNOSIS — I4891 Unspecified atrial fibrillation: Secondary | ICD-10-CM | POA: Diagnosis present

## 2019-11-07 HISTORY — DX: Nicotine dependence, unspecified, uncomplicated: F17.200

## 2019-11-07 HISTORY — PX: LEFT HEART CATH AND CORONARY ANGIOGRAPHY: CATH118249

## 2019-11-07 HISTORY — PX: IABP INSERTION: CATH118242

## 2019-11-07 HISTORY — DX: Alcohol abuse, uncomplicated: F10.10

## 2019-11-07 HISTORY — PX: RIGHT HEART CATH: CATH118263

## 2019-11-07 LAB — HEPATIC FUNCTION PANEL
ALT: 74 U/L — ABNORMAL HIGH (ref 0–44)
AST: 434 U/L — ABNORMAL HIGH (ref 15–41)
Albumin: 2.2 g/dL — ABNORMAL LOW (ref 3.5–5.0)
Alkaline Phosphatase: 73 U/L (ref 38–126)
Bilirubin, Direct: 1 mg/dL — ABNORMAL HIGH (ref 0.0–0.2)
Indirect Bilirubin: 1.2 mg/dL — ABNORMAL HIGH (ref 0.3–0.9)
Total Bilirubin: 2.2 mg/dL — ABNORMAL HIGH (ref 0.3–1.2)
Total Protein: 6.7 g/dL (ref 6.5–8.1)

## 2019-11-07 LAB — CBC WITH DIFFERENTIAL/PLATELET
Abs Immature Granulocytes: 0.7 10*3/uL — ABNORMAL HIGH (ref 0.00–0.07)
Basophils Absolute: 0.2 10*3/uL — ABNORMAL HIGH (ref 0.0–0.1)
Basophils Relative: 1 %
Eosinophils Absolute: 0 10*3/uL (ref 0.0–0.5)
Eosinophils Relative: 0 %
HCT: 37.6 % — ABNORMAL LOW (ref 39.0–52.0)
Hemoglobin: 12.3 g/dL — ABNORMAL LOW (ref 13.0–17.0)
Immature Granulocytes: 4 %
Lymphocytes Relative: 3 %
Lymphs Abs: 0.6 10*3/uL — ABNORMAL LOW (ref 0.7–4.0)
MCH: 28 pg (ref 26.0–34.0)
MCHC: 32.7 g/dL (ref 30.0–36.0)
MCV: 85.6 fL (ref 80.0–100.0)
Monocytes Absolute: 2.3 10*3/uL — ABNORMAL HIGH (ref 0.1–1.0)
Monocytes Relative: 12 %
Neutro Abs: 15.8 10*3/uL — ABNORMAL HIGH (ref 1.7–7.7)
Neutrophils Relative %: 80 %
Platelets: 25 10*3/uL — CL (ref 150–400)
RBC: 4.39 MIL/uL (ref 4.22–5.81)
RDW: 19.9 % — ABNORMAL HIGH (ref 11.5–15.5)
WBC: 18.7 10*3/uL — ABNORMAL HIGH (ref 4.0–10.5)
nRBC: 0 % (ref 0.0–0.2)

## 2019-11-07 LAB — COMPREHENSIVE METABOLIC PANEL
ALT: 69 U/L — ABNORMAL HIGH (ref 0–44)
AST: 432 U/L — ABNORMAL HIGH (ref 15–41)
Albumin: 1.9 g/dL — ABNORMAL LOW (ref 3.5–5.0)
Alkaline Phosphatase: 80 U/L (ref 38–126)
Anion gap: 17 — ABNORMAL HIGH (ref 5–15)
BUN: 65 mg/dL — ABNORMAL HIGH (ref 6–20)
CO2: 19 mmol/L — ABNORMAL LOW (ref 22–32)
Calcium: 6.9 mg/dL — ABNORMAL LOW (ref 8.9–10.3)
Chloride: 96 mmol/L — ABNORMAL LOW (ref 98–111)
Creatinine, Ser: 3.15 mg/dL — ABNORMAL HIGH (ref 0.61–1.24)
GFR calc Af Amer: 24 mL/min — ABNORMAL LOW (ref >60)
GFR calc non Af Amer: 21 mL/min — ABNORMAL LOW (ref >60)
Glucose, Bld: 113 mg/dL — ABNORMAL HIGH (ref 70–99)
Potassium: 3.7 mmol/L (ref 3.5–5.1)
Sodium: 132 mmol/L — ABNORMAL LOW (ref 135–145)
Total Bilirubin: 2 mg/dL — ABNORMAL HIGH (ref 0.3–1.2)
Total Protein: 5.7 g/dL — ABNORMAL LOW (ref 6.5–8.1)

## 2019-11-07 LAB — GLUCOSE, CAPILLARY: Glucose-Capillary: 104 mg/dL — ABNORMAL HIGH (ref 70–99)

## 2019-11-07 LAB — BASIC METABOLIC PANEL
Anion gap: 25 — ABNORMAL HIGH (ref 5–15)
BUN: 56 mg/dL — ABNORMAL HIGH (ref 6–20)
CO2: 18 mmol/L — ABNORMAL LOW (ref 22–32)
Calcium: 7.6 mg/dL — ABNORMAL LOW (ref 8.9–10.3)
Chloride: 90 mmol/L — ABNORMAL LOW (ref 98–111)
Creatinine, Ser: 3.79 mg/dL — ABNORMAL HIGH (ref 0.61–1.24)
GFR calc Af Amer: 19 mL/min — ABNORMAL LOW (ref >60)
GFR calc non Af Amer: 17 mL/min — ABNORMAL LOW (ref >60)
Glucose, Bld: 119 mg/dL — ABNORMAL HIGH (ref 70–99)
Potassium: 4.2 mmol/L (ref 3.5–5.1)
Sodium: 133 mmol/L — ABNORMAL LOW (ref 135–145)

## 2019-11-07 LAB — BRAIN NATRIURETIC PEPTIDE: B Natriuretic Peptide: 609.8 pg/mL — ABNORMAL HIGH (ref 0.0–100.0)

## 2019-11-07 LAB — CBC
HCT: 44.1 % (ref 39.0–52.0)
HCT: 34.9 % — ABNORMAL LOW (ref 39.0–52.0)
Hemoglobin: 13.8 g/dL (ref 13.0–17.0)
Hemoglobin: 11.4 g/dL — ABNORMAL LOW (ref 13.0–17.0)
MCH: 28 pg (ref 26.0–34.0)
MCH: 28.3 pg (ref 26.0–34.0)
MCHC: 31.3 g/dL (ref 30.0–36.0)
MCHC: 32.7 g/dL (ref 30.0–36.0)
MCV: 89.5 fL (ref 80.0–100.0)
MCV: 86.6 fL (ref 80.0–100.0)
Platelets: 26 10*3/uL — CL (ref 150–400)
Platelets: 25 10*3/uL — CL (ref 150–400)
RBC: 4.93 MIL/uL (ref 4.22–5.81)
RBC: 4.03 MIL/uL — ABNORMAL LOW (ref 4.22–5.81)
RDW: 20.6 % — ABNORMAL HIGH (ref 11.5–15.5)
RDW: 19.9 % — ABNORMAL HIGH (ref 11.5–15.5)
WBC: 16.8 10*3/uL — ABNORMAL HIGH (ref 4.0–10.5)
WBC: 16.5 10*3/uL — ABNORMAL HIGH (ref 4.0–10.5)
nRBC: 0 % (ref 0.0–0.2)
nRBC: 0 % (ref 0.0–0.2)

## 2019-11-07 LAB — HEMOGLOBIN A1C
Hgb A1c MFr Bld: 5.7 % — ABNORMAL HIGH (ref 4.8–5.6)
Mean Plasma Glucose: 116.89 mg/dL

## 2019-11-07 LAB — MRSA PCR SCREENING: MRSA by PCR: POSITIVE — AB

## 2019-11-07 LAB — TROPONIN I (HIGH SENSITIVITY)
Troponin I (High Sensitivity): >27000 ng/L (ref <18)
Troponin I (High Sensitivity): >27000 ng/L (ref <18)

## 2019-11-07 LAB — PROTIME-INR
INR: 1.5 — ABNORMAL HIGH (ref 0.8–1.2)
Prothrombin Time: 17.8 seconds — ABNORMAL HIGH (ref 11.4–15.2)

## 2019-11-07 LAB — ABO/RH: ABO/RH(D): O POS

## 2019-11-07 LAB — LACTIC ACID, PLASMA
Lactic Acid, Venous: 2.8 mmol/L (ref 0.5–1.9)
Lactic Acid, Venous: 2 mmol/L (ref 0.5–1.9)

## 2019-11-07 LAB — MAGNESIUM: Magnesium: 1.8 mg/dL (ref 1.7–2.4)

## 2019-11-07 LAB — TYPE AND SCREEN
ABO/RH(D): O POS
Antibody Screen: NEGATIVE

## 2019-11-07 LAB — SARS CORONAVIRUS 2 BY RT PCR (HOSPITAL ORDER, PERFORMED IN ~~LOC~~ HOSPITAL LAB): SARS Coronavirus 2: NEGATIVE

## 2019-11-07 LAB — PHOSPHORUS: Phosphorus: 6.9 mg/dL — ABNORMAL HIGH (ref 2.5–4.6)

## 2019-11-07 SURGERY — LEFT HEART CATH AND CORONARY ANGIOGRAPHY
Anesthesia: LOCAL

## 2019-11-07 SURGERY — IABP INSERTION
Anesthesia: LOCAL

## 2019-11-07 MED ORDER — HEPARIN (PORCINE) IN NACL 1000-0.9 UT/500ML-% IV SOLN
INTRAVENOUS | Status: AC
  Filled 2019-11-07: qty 500

## 2019-11-07 MED ORDER — NOREPINEPHRINE 4 MG/250ML-% IV SOLN
2.0000 ug/min | INTRAVENOUS | Status: DC
  Filled 2019-11-07: qty 250

## 2019-11-07 MED ORDER — SODIUM CHLORIDE 0.9 % IV SOLN
INTRAVENOUS | Status: AC | PRN
  Administered 2019-11-07: 50 mL/h via INTRAVENOUS

## 2019-11-07 MED ORDER — ASPIRIN 81 MG PO CHEW: 81.0000 mg | CHEWABLE_TABLET | Freq: Every day | ORAL | Status: DC

## 2019-11-07 MED ORDER — SODIUM CHLORIDE 0.9 % IV SOLN
250.0000 mL | INTRAVENOUS | Status: DC
  Administered 2019-11-07: 250 mL via INTRAVENOUS

## 2019-11-07 MED ORDER — VERAPAMIL HCL 2.5 MG/ML IV SOLN
INTRAVENOUS | Status: AC
  Filled 2019-11-07: qty 2

## 2019-11-07 MED ORDER — SODIUM CHLORIDE 0.9 % IV SOLN: 250.0000 mL | INTRAVENOUS | Status: DC | PRN

## 2019-11-07 MED ORDER — HYDRALAZINE HCL 20 MG/ML IJ SOLN: 10.0000 mg | INTRAMUSCULAR | Status: AC | PRN

## 2019-11-07 MED ORDER — ASPIRIN 81 MG PO CHEW
81.0000 mg | CHEWABLE_TABLET | Freq: Every day | ORAL | Status: DC
  Administered 2019-11-07 – 2019-11-08 (×2): 81 mg via ORAL
  Filled 2019-11-07 (×2): qty 1

## 2019-11-07 MED ORDER — SODIUM CHLORIDE 0.9 % IV SOLN
INTRAVENOUS | Status: AC | PRN
  Administered 2019-11-07: 10 mL/h via INTRAVENOUS

## 2019-11-07 MED ORDER — MIDAZOLAM HCL 2 MG/2ML IJ SOLN
INTRAMUSCULAR | Status: DC | PRN
  Administered 2019-11-07: 1 mg via INTRAVENOUS

## 2019-11-07 MED ORDER — SODIUM CHLORIDE 0.9% FLUSH: 3.0000 mL | INTRAVENOUS | Status: DC | PRN

## 2019-11-07 MED ORDER — HYDRALAZINE HCL 20 MG/ML IJ SOLN: 10.0000 mg | INTRAMUSCULAR | Status: DC | PRN

## 2019-11-07 MED ORDER — SODIUM CHLORIDE 0.9% FLUSH
3.0000 mL | Freq: Two times a day (BID) | INTRAVENOUS | Status: DC
  Administered 2019-11-08 – 2019-11-10 (×3): 3 mL via INTRAVENOUS

## 2019-11-07 MED ORDER — LIDOCAINE HCL (PF) 1 % IJ SOLN
INTRAMUSCULAR | Status: DC | PRN
  Administered 2019-11-07: 5 mL via SUBCUTANEOUS

## 2019-11-07 MED ORDER — ONDANSETRON HCL 4 MG/2ML IJ SOLN: 4.0000 mg | Freq: Four times a day (QID) | INTRAMUSCULAR | Status: DC | PRN

## 2019-11-07 MED ORDER — SODIUM CHLORIDE 0.9 % IV BOLUS
1000.0000 mL | Freq: Once | INTRAVENOUS | Status: AC
  Administered 2019-11-07: 1000 mL via INTRAVENOUS

## 2019-11-07 MED ORDER — ATORVASTATIN CALCIUM 80 MG PO TABS
80.0000 mg | ORAL_TABLET | Freq: Every day | ORAL | Status: DC
  Administered 2019-11-08: 80 mg via ORAL
  Filled 2019-11-07: qty 1

## 2019-11-07 MED ORDER — ADULT MULTIVITAMIN W/MINERALS CH
1.0000 | ORAL_TABLET | Freq: Every day | ORAL | Status: DC
  Administered 2019-11-07: 1 via ORAL
  Filled 2019-11-07: qty 1

## 2019-11-07 MED ORDER — ACETAMINOPHEN 325 MG PO TABS: 650.0000 mg | ORAL_TABLET | ORAL | Status: DC | PRN

## 2019-11-07 MED ORDER — MAGNESIUM SULFATE 2 GM/50ML IV SOLN
2.0000 g | Freq: Once | INTRAVENOUS | Status: AC
  Administered 2019-11-07: 2 g via INTRAVENOUS
  Filled 2019-11-07: qty 50

## 2019-11-07 MED ORDER — SODIUM CHLORIDE 0.9 % IV BOLUS
250.0000 mL | Freq: Once | INTRAVENOUS | Status: AC
  Administered 2019-11-07: 250 mL via INTRAVENOUS

## 2019-11-07 MED ORDER — FOLIC ACID 1 MG PO TABS
1.0000 mg | ORAL_TABLET | Freq: Every day | ORAL | Status: DC
  Administered 2019-11-07: 1 mg via ORAL
  Filled 2019-11-07: qty 1

## 2019-11-07 MED ORDER — ORAL CARE MOUTH RINSE
15.0000 mL | Freq: Two times a day (BID) | OROMUCOSAL | Status: DC
  Administered 2019-11-08 (×2): 15 mL via OROMUCOSAL

## 2019-11-07 MED ORDER — LIDOCAINE HCL (PF) 1 % IJ SOLN
INTRAMUSCULAR | Status: DC | PRN
  Administered 2019-11-07: 15 mL

## 2019-11-07 MED ORDER — DIAZEPAM 5 MG PO TABS: 5.0000 mg | ORAL_TABLET | Freq: Four times a day (QID) | ORAL | Status: DC | PRN

## 2019-11-07 MED ORDER — SODIUM CHLORIDE 0.9% FLUSH
3.0000 mL | Freq: Two times a day (BID) | INTRAVENOUS | Status: DC
  Administered 2019-11-08: 3 mL via INTRAVENOUS

## 2019-11-07 MED ORDER — THIAMINE HCL 100 MG/ML IJ SOLN
100.0000 mg | Freq: Every day | INTRAMUSCULAR | Status: DC
  Administered 2019-11-07 – 2019-11-10 (×4): 100 mg via INTRAVENOUS
  Filled 2019-11-07 (×4): qty 2

## 2019-11-07 MED ORDER — THIAMINE HCL 100 MG PO TABS: 100.0000 mg | ORAL_TABLET | Freq: Every day | ORAL | Status: DC

## 2019-11-07 MED ORDER — LIDOCAINE HCL (PF) 1 % IJ SOLN
INTRAMUSCULAR | Status: AC
  Filled 2019-11-07: qty 30

## 2019-11-07 MED ORDER — HEPARIN SODIUM (PORCINE) 1000 UNIT/ML IJ SOLN
INTRAMUSCULAR | Status: AC
  Filled 2019-11-07: qty 1

## 2019-11-07 MED ORDER — SODIUM CHLORIDE 0.9% FLUSH: 10.0000 mL | INTRAVENOUS | Status: DC | PRN

## 2019-11-07 MED ORDER — NITROGLYCERIN 1 MG/10 ML FOR IR/CATH LAB
INTRA_ARTERIAL | Status: AC
  Filled 2019-11-07: qty 10

## 2019-11-07 MED ORDER — MIDAZOLAM HCL 2 MG/2ML IJ SOLN
INTRAMUSCULAR | Status: AC
  Filled 2019-11-07: qty 2

## 2019-11-07 MED ORDER — NOREPINEPHRINE 4 MG/250ML-% IV SOLN
0.0000 ug/min | INTRAVENOUS | Status: DC
  Administered 2019-11-07: 6 ug/min via INTRAVENOUS
  Administered 2019-11-08: 17 ug/min via INTRAVENOUS
  Administered 2019-11-08: 18 ug/min via INTRAVENOUS
  Filled 2019-11-07 (×2): qty 250

## 2019-11-07 MED ORDER — LABETALOL HCL 5 MG/ML IV SOLN: 10.0000 mg | INTRAVENOUS | Status: AC | PRN

## 2019-11-07 MED ORDER — NOREPINEPHRINE BITARTRATE 1 MG/ML IV SOLN
INTRAVENOUS | Status: AC | PRN
  Administered 2019-11-07: 5 ug/min via INTRAVENOUS

## 2019-11-07 MED ORDER — HEPARIN (PORCINE) IN NACL 1000-0.9 UT/500ML-% IV SOLN
INTRAVENOUS | Status: AC
  Filled 2019-11-07: qty 1500

## 2019-11-07 MED ORDER — SODIUM CHLORIDE 0.9 % IV SOLN: INTRAVENOUS | Status: DC

## 2019-11-07 MED ORDER — MUPIROCIN 2 % EX OINT
1.0000 "application " | TOPICAL_OINTMENT | Freq: Two times a day (BID) | CUTANEOUS | Status: DC
  Administered 2019-11-07 – 2019-11-10 (×6): 1 via NASAL
  Filled 2019-11-07 (×2): qty 22

## 2019-11-07 MED ORDER — HEPARIN (PORCINE) IN NACL 1000-0.9 UT/500ML-% IV SOLN
INTRAVENOUS | Status: DC | PRN
  Administered 2019-11-07 (×2): 500 mL

## 2019-11-07 MED ORDER — CHLORHEXIDINE GLUCONATE CLOTH 2 % EX PADS
6.0000 | MEDICATED_PAD | Freq: Every day | CUTANEOUS | Status: DC
  Administered 2019-11-07 – 2019-11-10 (×5): 6 via TOPICAL

## 2019-11-07 MED ORDER — IOHEXOL 350 MG/ML SOLN
INTRAVENOUS | Status: DC | PRN
  Administered 2019-11-07: 30 mL

## 2019-11-07 MED ORDER — IOHEXOL 350 MG/ML SOLN
INTRAVENOUS | Status: AC
  Filled 2019-11-07: qty 1

## 2019-11-07 MED ORDER — LORAZEPAM 2 MG/ML IJ SOLN
1.0000 mg | INTRAMUSCULAR | Status: DC | PRN
  Administered 2019-11-08: 2 mg via INTRAVENOUS
  Filled 2019-11-07: qty 1

## 2019-11-07 MED ORDER — LABETALOL HCL 5 MG/ML IV SOLN: 10.0000 mg | INTRAVENOUS | Status: DC | PRN

## 2019-11-07 MED ORDER — LORAZEPAM 1 MG PO TABS: 1.0000 mg | ORAL_TABLET | ORAL | Status: DC | PRN

## 2019-11-07 MED ORDER — SODIUM CHLORIDE 0.9% FLUSH
10.0000 mL | Freq: Two times a day (BID) | INTRAVENOUS | Status: DC
  Administered 2019-11-07 – 2019-11-10 (×5): 10 mL

## 2019-11-07 MED ORDER — HEPARIN (PORCINE) IN NACL 1000-0.9 UT/500ML-% IV SOLN
INTRAVENOUS | Status: DC | PRN
  Administered 2019-11-07 (×3): 500 mL

## 2019-11-07 MED ORDER — NOREPINEPHRINE 4 MG/250ML-% IV SOLN: 2.0000 ug/min | INTRAVENOUS | Status: DC

## 2019-11-07 SURGICAL SUPPLY — 12 items
CATH INFINITI 5FR MULTPACK ANG (CATHETERS) ×2 IMPLANT
GLIDESHEATH SLEND SS 6F .021 (SHEATH) ×2 IMPLANT
GUIDEWIRE INQWIRE 1.5J.035X260 (WIRE) ×1 IMPLANT
INQWIRE 1.5J .035X260CM (WIRE) ×2
KIT ENCORE 26 ADVANTAGE (KITS) ×2 IMPLANT
KIT HEART LEFT (KITS) ×2 IMPLANT
PACK CARDIAC CATHETERIZATION (CUSTOM PROCEDURE TRAY) ×2 IMPLANT
SHEATH PINNACLE 6F 10CM (SHEATH) ×2 IMPLANT
SHEATH PINNACLE 7F 10CM (SHEATH) ×2 IMPLANT
TRANSDUCER W/STOPCOCK (MISCELLANEOUS) ×2 IMPLANT
TUBING CIL FLEX 10 FLL-RA (TUBING) ×2 IMPLANT
WIRE EMERALD 3MM-J .035X150CM (WIRE) ×2 IMPLANT

## 2019-11-07 SURGICAL SUPPLY — 12 items
BALLN IABP SENSA PLUS 8F 50CC (BALLOONS) ×2
BALLOON IABP SENS PLUS 8F 50CC (BALLOONS) ×1 IMPLANT
CATH SWAN GANZ VIP 7.5F (CATHETERS) ×2 IMPLANT
KIT HEART LEFT (KITS) ×2 IMPLANT
PACK CARDIAC CATHETERIZATION (CUSTOM PROCEDURE TRAY) ×2 IMPLANT
SHEATH PINNACLE 6F 10CM (SHEATH) ×2 IMPLANT
SHEATH PINNACLE 8F 10CM (SHEATH) ×2 IMPLANT
SLEEVE REPOSITIONING LENGTH 30 (MISCELLANEOUS) ×2 IMPLANT
TRANSDUCER W/STOPCOCK (MISCELLANEOUS) ×2 IMPLANT
TUBING CIL FLEX 10 FLL-RA (TUBING) ×2 IMPLANT
WIRE EMERALD 3MM-J .025X260CM (WIRE) ×2 IMPLANT
WIRE EMERALD 3MM-J .035X150CM (WIRE) ×2 IMPLANT

## 2019-11-07 NOTE — Progress Notes (Signed)
Chaplain responded to STEMI - pt is not available and no family is present.  Please call if support is needed.   Belia Heman, [place] [phone]    11/12/2019 1200  Clinical Encounter Type  Visited With Patient not available  Visit Type Initial (STEMI)  Consult/Referral To Chaplain

## 2019-11-07 NOTE — H&P (Addendum)
Cardiology Admission History and Physical:   Patient ID: Karl Alvarado MRN: [id number]; DOB: 1960-09-11   Admission date: 11/19/2019  Primary Care Provider: [place], Julie M, NP [facility] Cardiologist: New to [facility] (Dr. Tresa Endo) [facility] Electrophysiologist:  None   Chief Complaint:  Chest Pain/STEMI  Patient Profile:   Karl Alvarado is a 59 y.o. male with a history of hypertension, prior DVT, alcohol abuse who presents with inferolateral STEMI.  History of Present Illness:   Mr. Karl Alvarado is a 59 year old male with a history of hypertension and alcohol abuse who presents with inferolateral STEMI. No known cardiac history. Patient very somnolent on arrival so history obtained from mother. Mother states patient has not been feeling well for the past week. She states patient has been very weak with poor PO intake. The only thing he has been drinking all week is Pepsi. Then this morning patient started complaining of substernal chest pain. He did not complain of any radiation of pain. No shortness of breath or diaphoresis. No vomiting. Patient was brought to the ED and found to have ST elevation in inferior and lateral leads. He was also hypotensive on arrival. CODE STEMI was called and patient was given 324mg  of Aspirin and 500cc fluid bolus. Pain free in the ED. He was swabbed for COVID-19 and taken immediately to cath lab for emergent cardiac catheterization.   Patient lives with his mother. Per mother, patient drinks a pint of Vodka daily but has not had any alcohol in the past week. He is also an active smoker.  Past Medical History:  Diagnosis Date  . Hypertension   . Seizures (HCC)     Past Surgical History:  Procedure Laterality Date  . APPENDECTOMY    . REPAIR OF ESOPHAGUS  2014     Medications Prior to Admission: Prior to Admission medications   Medication Sig Start Date End Date Taking? Authorizing Provider  apixaban (ELIQUIS) 5 MG TABS tablet Take 2 tablets  (10mg ) twice daily for 7 days, then 1 tablet (5mg ) twice daily 02/02/19   , MD  apixaban (ELIQUIS) 5 MG TABS tablet Take 1 tablet (5 mg total) by mouth 2 (two) times daily. 02/24/19   13/2/20, MD  diphenhydramine-acetaminophen (TYLENOL PM) 25-500 MG TABS tablet Take 1 tablet by mouth at bedtime as needed (sleep).    [provider]     Allergies:    Allergies  Allergen Reactions  . Penicillins Hives and Rash    Rash  Has patient had a PCN reaction causing immediate rash, facial/tongue/throat swelling, SOB or lightheadedness with hypotension: YES Has patient had a PCN reaction causing severe rash involving mucus membranes or skin necrosis: NO Has patient had a PCN reaction that required hospitalizationNO Has patient had a PCN reaction occurring within the last 10 years: NO If all of the above answers are "NO", then may proceed with Cephalosporin use.    Social History:   Social History   Socioeconomic History  . Marital status: Single    Spouse name: Not on file  . Number of children: 2  . Years of education: Not on file  . Highest education level: Not on file  Occupational History  . Occupation: Unemployed  Tobacco Use  . Smoking status: Current Every Day Smoker    Packs/day: 0.00    Types: Cigars  . Smokeless tobacco: Never Used  Substance and Sexual Activity  . Alcohol use: No    Comment: used  to be a heavy drinker  . Drug use: No  . Sexual activity: Not on file  Other Topics Concern  . Not on file  Social History Narrative  . Not on file   Social Determinants of Health   Financial Resource Strain:   . Difficulty of Paying Living Expenses:   Food Insecurity:   . Worried About Programme researcher, broadcasting/film/videounning Out of Food in the Last Year:   . Baristaan Out of Food in the Last Year:   Transportation Needs:   . Freight forwarderLack of Transportation (Medical):   Marland Kitchen. Lack of Transportation (Non-Medical):   Physical Activity:   . Days of Exercise per Week:   . Minutes of Exercise  per Session:   Stress:   . Feeling of Stress :   Social Connections:   . Frequency of Communication with Friends and Family:   . Frequency of Social Gatherings with Friends and Family:   . Attends Religious Services:   . Active Member of Clubs or Organizations:   . Attends BankerClub or Organization Meetings:   Marland Kitchen. Marital Status:   Intimate Partner Violence:   . Fear of Current or Ex-Partner:   . Emotionally Abused:   Marland Kitchen. Physically Abused:   . Sexually Abused:     Family History:   The patient's family history includes Diabetes in his mother; Prostate cancer in his father.    ROS:  Please see the history of present illness.  Unable to obtain full ROS due to need for emergent cardiac catheterization.  Physical Exam/Data:   Vitals:   11/01/2019 1317 12/01/2019 1318 11/17/2019 1319 11/21/2019 1319  BP:      Pulse: (!) 121 (!) 117 (!) 117   Resp: (!) 28 (!) 36 (!) 27   SpO2: 98% 100% 100% 100%  Weight:    72.6 kg  Height:    6\' 2"  (1.88 m)   No intake or output data in the 24 hours ending 11/24/2019 1345 Last 3 Weights 11/30/2019 02/24/2019 02/02/2019  Weight (lbs) 160 lb 146 lb 9.6 oz 160 lb  Weight (kg) 72.576 kg 66.497 kg 72.576 kg     Body mass index is 20.54 kg/m.  General: 59 y.o. male in no acute distress. Somnolent. Looks older than stated age. HEENT: Normocephalic and atraumatic.  Neck: Supple. No JVD noted. Heart: Tachycardic. No murmurs, gallops, or rubs. Radial and distal pedal pulses present. Lungs: No increased work of breathing. Clear to ausculation bilaterally. No wheezes, rhonchi, or rales.  Abdomen: Soft, non-distended, and non-tender to palpation.  Extremities: No lower extremity edema.    Skin: Warm and dry. Neuro: Somnolent. Psych: Somnolent.     EKG:  The ECG that was done  was personally reviewed and demonstrates sinus tachycardia with ST elevated in inferior leads and V3-V6.  Relevant CV Studies: N/A.  Laboratory Data:  High Sensitivity Troponin:  No  results for input(s): TROPONINIHS in the last 720 hours.    ChemistryNo results for input(s): NA, K, CL, CO2, GLUCOSE, BUN, CREATININE, CALCIUM, GFRNONAA, GFRAA, ANIONGAP in the last 168 hours.  No results for input(s): PROT, ALBUMIN, AST, ALT, ALKPHOS, BILITOT in the last 168 hours. Hematology Recent Labs  Lab 11/03/2019 1308  WBC 16.8*  RBC 4.93  HGB 13.8  HCT 44.1  MCV 89.5  MCH 28.0  MCHC 31.3  RDW 20.6*  PLT PENDING   BNPNo results for input(s): BNP, PROBNP in the last 168 hours.  DDimer No results for input(s): DDIMER in the last 168 hours.  Radiology/Studies:  DG Chest Port 1 View  Result Date: 11/15/2019 CLINICAL DATA:  Chest pain EXAM: PORTABLE CHEST 1 VIEW COMPARISON:  None. FINDINGS: The heart size and mediastinal contours are within normal limits. Both lungs are clear. Multiple chronic, callused fracture deformities of the ribs. IMPRESSION: No acute abnormality of the lungs in AP portable projection. Electronically Signed   By: Lauralyn Primes M.D.   On: 11/04/2019 13:38   TIMI Risk Score for ST  Elevation MI:   The patient's TIMI risk score is 7, which indicates a 23.4% risk of all cause mortality at 30 days.     Assessment and Plan:   Acute Inferolateral STEMI - Patient presented with weakness and decreased PO intake x1 week and then chest pain on day of presentation. - EKG showed ST elevations in inferior leads and V3-V6.  - High-sensitivity troponin pending.  - Will check Echo.  - Routine labs pending. - Will check lipid panel and LFTs. - Further recommendations following cardiac catheterization. - Continue Aspirin. Will hold starting statin given markedly elevated LFTs in the past and significant alcohol abuse.  Hypotensive - History of hypertension but hypotensive on arrival. - Started on 500cc bolus in the ED.   Thrombocytopenia - Platelets 26,000 on admission. Most recent platelet count 122,000 but 95,000 in 09/2015. - Suspect this is due to alcohol  abuse. - Continue to monitor closely.  Alcohol Abuse - Patient's mother states he drinks 1 pint of Vodka daily but has not had anything to drink in 1 week. - LFTS in 02/2019 markedly elevated.  - Will place on CIWA protocol.   Tobacco Abuse - Will need to counsel on importance of complete cessation.     Severity of Illness: The appropriate patient status for this patient is INPATIENT. Inpatient status is judged to be reasonable and necessary in order to provide the required intensity of service to ensure the patient's safety. The patient's presenting symptoms, physical exam findings, and initial radiographic and laboratory data in the context of their chronic comorbidities is felt to place them at high risk for further clinical deterioration. Furthermore, it is not anticipated that the patient will be medically stable for discharge from the hospital within 2 midnights of admission. The following factors support the patient status of inpatient.   " The patient's presenting symptoms include chest pain and generalized waekness. " The worrisome physical exam findings include hypotension. " The initial radiographic and laboratory data are worrisome because of STEMI on EKG. " The chronic co-morbidities include HTN and alcohol abuse.   * I certify that at the point of admission it is my clinical judgment that the patient will require inpatient hospital care spanning beyond 2 midnights from the point of admission due to high intensity of service, high risk for further deterioration and high frequency of surveillance required.*      For questions or updates, please contact [facility] Please consult [email or web address] for contact info under     Signed, Corrin Parker, PA-C  11/27/2019 1:45 PM    Patient seen and examined. Agree with assessment and plan. Mr. Hancel Ion is a 59 year old divorced gentleman who recently has been living with his mother. He has a history of tobacco use,  significant alcohol use and reportedly was drinking up to a pint of 90 proof vodka almost on a daily basis. He has a remote history of DVT which occurred subsequent to a fall. In the past he has been documented to have markedly elevated transient  liver function studies. Over the past week, he has developed progressive weakness and debility. His appetite was poor. He was not drinking the past week. Today he had experienced some earlier pressure in his chest. Ultimately due to progressive weakness EMS was called. His ECG showed inferolateral ST elevation and a code STEMI was activated. Upon arrival to the emergency room, he was hypotensive with blood pressures in the 70s and he was treated with fluid bolus. Laboratory was sent. He was given 4000 units of heparin and 325 mg aspirin. He was taken emergently to the catheterization laboratory. Catheterization shows thrombus biotic narrowing of the LAD ostium extending into the distal left main with TIMI-3 flow and narrowing of approximately 80%. There does not appear to be any atherosclerotic stenosis. Hand-injection left ventriculography showed severe hypokinesis involving the anterolateral wall extending from the upper mid segment around the apex extending to the distal inferior wall. LVEDP was 12 mm. EF estimate was 35%. During the procedure, initial laboratory came back with profound thrombocytopeniA with initial lab of 27 and repeat lab at 22k. In addition an i-STAT in the Cath Lab showed a creatinine of 2.7, but subsequent chemistry revealed a creatinine of 3.79. AST is elevated at 434 with bilirubin of 2.2. Troponin greater than 27,000. Subsequent formal CBC revealed platelet counts of 26 and 25,000. As result of his profound laboratory abnormalities, he was not a candidate for intervention due to inability for anticoagulation with his profound thrombocytopenia. Venous sheath was inserted to allow additional access for IV if necessary. Arterial venous sheaths were  sutured in place since with his platelet count of 22,000 sheath pulling was deferred. Patient is not a candidate for heparinization or glycoprotein 2B3 in addition due to his profound thrombocytopenia. Patient left the catheterization laboratory with stable hemodynamics with systolic blood pressure in the upper 90s and chest pain-free. If he develops progressive hypotension, he may require initiation of inotropic support.  Lennette Bihari, MD, [facility] 11/16/2019 3:12 PM

## 2019-11-07 NOTE — ED Triage Notes (Signed)
Pt here from home as a code stemi and hypotensive , pt stats that he has not been feeling well for a few days , no chest pain

## 2019-11-07 NOTE — ED Provider Notes (Signed)
[facility] CARDIAC CATH LAB Provider Note   CSN: [id number] Arrival date & time: 11-20-19  1255     History No chief complaint on file.   Karl Alvarado is a 59 y.o. male.  Level 5 caveat secondary to acuity of condition.  59 year old male history of hypertension here with malaise for 1 week.  Denies any chest pain.  Some cough nonproductive.  No known fevers or chills.  He said a little bit of vomiting.  Prehospital EKG showed inferior lateral ST elevations and he was activated as a code STEMI.  Given aspirin.  IV fluids.  He denies any chest pain on arrival.  States he fell 4 days ago.  Denies headache.  Drinks alcohol daily but has not drank in a week. The history is provided by the patient, the EMS personnel and a parent.  Weakness Severity:  Severe Onset quality:  Gradual Duration:  5 days Timing:  Constant Progression:  Worsening Chronicity:  New Context: alcohol use   Relieved by:  Nothing Worsened by:  Activity Ineffective treatments:  Drinking fluids Associated symptoms: cough, dizziness, falls and lethargy   Associated symptoms: no abdominal pain, no chest pain, no dysuria, no fever and no shortness of breath        Past Medical History:  Diagnosis Date  . Hypertension   . Seizures [facility])     Patient Active Problem List   Diagnosis Date Noted  . 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 Alcohol abuse 11/02/2015    Past Surgical History:  Procedure Laterality Date  . APPENDECTOMY    . REPAIR OF ESOPHAGUS  2014       Family History  Problem Relation Age of Onset  . Diabetes Mother   . Prostate cancer Father     Social History   Tobacco Use  . Smoking status: Current Every Day Smoker    Packs/day: 0.00    Types: Cigars  . Smokeless tobacco: Never Used  Substance Use Topics  . Alcohol use: No    Comment: used to be a heavy drinker  . Drug use: No    Home Medications Prior to Admission medications   Medication Sig Start Date End Date  Taking? Authorizing Provider  apixaban (ELIQUIS) 5 MG TABS tablet Take 2 tablets (10mg ) twice daily for 7 days, then 1 tablet (5mg ) twice daily 02/02/19   , MD  apixaban (ELIQUIS) 5 MG TABS tablet Take 1 tablet (5 mg total) by mouth 2 (two) times daily. 02/24/19   Derwood Kaplan, MD  diphenhydramine-acetaminophen (TYLENOL PM) 25-500 MG TABS tablet Take 1 tablet by mouth at bedtime as needed (sleep).    [provider]    Allergies    Penicillins  Review of Systems   Review of Systems  Constitutional: Negative for fever.  HENT: Negative for sore throat.   Eyes: Negative for pain.  Respiratory: Positive for cough. Negative for shortness of breath.   Cardiovascular: Negative for chest pain.  Gastrointestinal: Negative for abdominal pain.  Genitourinary: Negative for dysuria.  Musculoskeletal: Positive for falls. Negative for back pain.  Skin: Negative for rash.  Neurological: Positive for dizziness and weakness.    Physical Exam Updated Vital Signs BP (!) 79/63   Pulse (!) 117   Resp (!) 27   Ht 6\' 2"  (1.88 m)   Wt 72.6 kg   SpO2 100%   BMI 20.54 kg/m   Physical Exam Vitals and nursing note reviewed.  Constitutional:  Appearance: He is well-developed. He is ill-appearing.  HENT:     Head: Normocephalic and atraumatic.  Eyes:     Conjunctiva/sclera: Conjunctivae normal.  Cardiovascular:     Rate and Rhythm: Regular rhythm. Tachycardia present.     Heart sounds: No murmur heard.   Pulmonary:     Effort: Pulmonary effort is normal. No respiratory distress.     Breath sounds: Normal breath sounds.     Comments: Pleural rub Abdominal:     Palpations: Abdomen is soft. There is no mass.     Tenderness: There is no abdominal tenderness. There is no guarding or rebound.  Musculoskeletal:        General: No deformity or signs of injury. Normal range of motion.     Cervical back: Neck supple.  Skin:    General: Skin is warm and dry.      Capillary Refill: Capillary refill takes less than 2 seconds.  Neurological:     General: No focal deficit present.     Mental Status: He is alert.     Comments: Patient is awake and follows commands moves all extremities.  Opens eyes to questioning but prefers to keep in close.  Very soft-spoken.  No obvious slurred speech facial droop.     ED Results / Procedures / Treatments   Labs (all labs ordered are listed, but only abnormal results are displayed) Labs Reviewed  BASIC METABOLIC PANEL - Abnormal; Notable for the following components:      Result Value   Sodium 133 (*)    Chloride 90 (*)    CO2 18 (*)    Glucose, Bld 119 (*)    BUN 56 (*)    Creatinine, Ser 3.79 (*)    Calcium 7.6 (*)    GFR calc non Af Amer 17 (*)    GFR calc Af Amer 19 (*)    Anion gap 25 (*)    All other components within normal limits  CBC - Abnormal; Notable for the following components:   WBC 16.8 (*)    RDW 20.6 (*)    Platelets 26 (*)    All other components within normal limits  PROTIME-INR - Abnormal; Notable for the following components:   Prothrombin Time 17.8 (*)    INR 1.5 (*)    All other components within normal limits  BRAIN NATRIURETIC PEPTIDE - Abnormal; Notable for the following components:   B Natriuretic Peptide 609.8 (*)    All other components within normal limits  HEPATIC FUNCTION PANEL - Abnormal; Notable for the following components:   Albumin 2.2 (*)    AST 434 (*)    ALT 74 (*)    Total Bilirubin 2.2 (*)    Bilirubin, Direct 1.0 (*)    Indirect Bilirubin 1.2 (*)    All other components within normal limits  HEMOGLOBIN A1C - Abnormal; Notable for the following components:   Hgb A1c MFr Bld 5.7 (*)    All other components within normal limits  PHOSPHORUS - Abnormal; Notable for the following components:   Phosphorus 6.9 (*)    All other components within normal limits  CBC - Abnormal; Notable for the following components:   WBC 16.5 (*)    RBC 4.03 (*)     Hemoglobin 11.4 (*)    HCT 34.9 (*)    RDW 19.9 (*)    Platelets 25 (*)    All other components within normal limits  TROPONIN I (HIGH SENSITIVITY) - Abnormal; Notable  for the following components:   Troponin I (High Sensitivity) >27,000 (*)    All other components within normal limits  SARS CORONAVIRUS 2 BY RT PCR (HOSPITAL ORDER, PERFORMED IN  HOSPITAL LAB)  MAGNESIUM  LIPID PANEL  CBC  BASIC METABOLIC PANEL  TROPONIN I (HIGH SENSITIVITY)    EKG EKG Interpretation  Date/Time:  Saturday Nov 11, 2019 13:05:21 EDT Ventricular Rate:  123 PR Interval:    QRS Duration: 76 QT Interval:  299 QTC Calculation: 428 R Axis:   95 Text Interpretation: Sinus tachycardia Inferior infarct, acute (LCx) Anteroseptal infarct, age indeterminate Lateral leads are also involved >>> Acute MI <<< Confirmed by Meridee Score [phone]) on 11/11/2019 1:32:00 PM   Radiology CARDIAC CATHETERIZATION  Result Date: Nov 11, 2019  Dist LM to Prox LAD lesion is 80% stenosed.  LV end diastolic pressure is normal.  There is evidence for thrombosis involving the LAD ostium extending partially into the very distal left main. There does not appear to be atherosclerotic stenosis but residual narrowing is 80%. There is brisk TIMI-3 flow. Normal left circumflex and dominant RCA. Moderately severe LV dysfunction with severe hypokinesis involving the anterolateral wall extending around the apex to the distal inferoapical segment. There is good contractility of the basal anterior wall and mid to basal inferior wall. EF estimate 35%. LVEDP 12 mmHg. Total contrast:  less than 30 cc RECOMMENDATION: During the procedure, the patient CBC came back which showed a platelet count of 27,000. Subsequent platelet count continue to confirm severe thrombocytopenia. In addition, laboratory at the completion of the procedure showed a creatinine of 3.79. Due to the patient's profound thrombocytopenia, he is not a candidate for current  intervention or thrombectomy. A venous sheath was inserted at the end of the procedure to allow for improved access if the patient's one IV fails. If continued hypotension develops, he may require inotropic support.   DG Chest Port 1 View  Result Date: Nov 11, 2019 CLINICAL DATA:  Chest pain EXAM: PORTABLE CHEST 1 VIEW COMPARISON:  None. FINDINGS: The heart size and mediastinal contours are within normal limits. Both lungs are clear. Multiple chronic, callused fracture deformities of the ribs. IMPRESSION: No acute abnormality of the lungs in AP portable projection. Electronically Signed   By: Lauralyn Primes M.D.   On: 2019-11-11 13:38    Procedures .Critical Care Performed by: Terrilee Files, MD Authorized by: Terrilee Files, MD   Critical care provider statement:    Critical care time (minutes):  30   Critical care time was exclusive of:  Separately billable procedures and treating other patients   Critical care was necessary to treat or prevent imminent or life-threatening deterioration of the following conditions:  Cardiac failure and circulatory failure   Critical care was time spent personally by me on the following activities:  Discussions with consultants, evaluation of patient's response to treatment, examination of patient, ordering and performing treatments and interventions, ordering and review of laboratory studies, ordering and review of radiographic studies, pulse oximetry, re-evaluation of patient's condition, obtaining history from patient or surrogate, review of old charts and development of treatment plan with patient or surrogate   I assumed direction of critical care for this patient from another provider in my specialty: no     (including critical care time)  Medications Ordered in ED Medications  LORazepam (ATIVAN) tablet 1-4 mg (has no administration in time range)    Or  LORazepam (ATIVAN) injection 1-4 mg (has no administration in time range)  thiamine  tablet 100  mg ( Oral See Alternative 11/26/2019 1613)    Or  thiamine (B-1) injection 100 mg (100 mg Intravenous Given 12/01/2019 1613)  folic acid (FOLVITE) tablet 1 mg (1 mg Oral Given 11/25/2019 1611)  multivitamin with minerals tablet 1 tablet (1 tablet Oral Given 11/16/2019 1611)  labetalol (NORMODYNE) injection 10 mg (has no administration in time range)  hydrALAZINE (APRESOLINE) injection 10 mg (has no administration in time range)  acetaminophen (TYLENOL) tablet 650 mg (has no administration in time range)  ondansetron (ZOFRAN) injection 4 mg (has no administration in time range)  0.9 %  sodium chloride infusion ( Intravenous Rate/Dose Verify 11/15/2019 1700)  sodium chloride flush (NS) 0.9 % injection 3 mL (has no administration in time range)  sodium chloride flush (NS) 0.9 % injection 3 mL (has no administration in time range)  0.9 %  sodium chloride infusion (has no administration in time range)  diazepam (VALIUM) tablet 5 mg (has no administration in time range)  aspirin chewable tablet 81 mg (81 mg Oral Given 11/15/2019 1611)  MEDLINE mouth rinse (has no administration in time range)  sodium chloride 0.9 % bolus 1,000 mL (0 mLs Intravenous Stopping Infusion hung by another clincian 11/01/2019 1337)  0.9 %  sodium chloride infusion (10 mL/hr Intravenous New Bag/Given 11/25/2019 1349)  0.9 %  sodium chloride infusion (50 mL/hr Intravenous New Bag/Given 11/21/2019 1432)    ED Course  I have reviewed the triage vital signs and the nursing notes.  Pertinent labs & imaging results that were available during my care of the patient were reviewed by me and considered in my medical decision making (see chart for details).  Clinical Course as of Nov 07 1711  Sat Nov 07, 2019  1340 Patient was evaluated by Dr. Tresa EndoKelly STEMI attending who is taking the patient up to the Cath Lab for evaluation.  He asked if we would start the patient on heparin.   [MB]    Clinical Course User Index [MB] Terrilee FilesButler, Karlene Southard C, MD   MDM  Rules/Calculators/A&P                         This patient complains of generalized weakness, ST elevation MI this involves an extensive number of treatment Options and is a complaint that carries with it a high risk of complications and Morbidity. The differential includes acute MI, metabolic derangement, anemia, renal failure, pneumothorax, PE, cardiomyopathy, vascular  I ordered, reviewed and interpreted labs, which included CBC with elevated white count, normal hemoglobin critically low platelets, chemistries with low bicarb newly elevated BUN and creatinine, abnormal INR, troponin critically high at greater than 27,000, Covid testing negative I ordered medication IV fluids for hypotension and IV heparin I ordered imaging studies which included portable chest x-ray and I independently    visualized and interpreted imaging which showed no gross pneumothorax Additional history obtained from EMS and patient's mother Previous records obtained and reviewed in epic, no recent admissions I consulted Dr. Tresa EndoKelly cardiology and discussed lab and imaging findings  Critical Interventions: Stabilization of hypotension with IV fluids and mobilization to catheterization lab  After the interventions stated above, I reevaluated the patient and found to be critically ill.  Most of his lab work was not back by time of transfer to STEMI lab.   Final Clinical Impression(s) / ED Diagnoses Final diagnoses:  Acute MI, inferior wall, initial episode of care (HCC)  Thrombocytopenia (HCC)  AKI (acute kidney injury) (HCC)  Rx / DC Orders ED Discharge Orders    None       Terrilee Files, MD 11/20/19 1718

## 2019-11-07 NOTE — ED Notes (Signed)
324 mg asa and 500 of fluid given by ems

## 2019-11-08 ENCOUNTER — Other Ambulatory Visit (HOSPITAL_COMMUNITY): Payer: Self-pay

## 2019-11-08 ENCOUNTER — Inpatient Hospital Stay (HOSPITAL_COMMUNITY): Payer: Self-pay

## 2019-11-08 ENCOUNTER — Encounter (HOSPITAL_COMMUNITY): Payer: Self-pay | Admitting: Cardiovascular Disease

## 2019-11-08 ENCOUNTER — Other Ambulatory Visit: Payer: Self-pay

## 2019-11-08 DIAGNOSIS — A419 Sepsis, unspecified organism: Secondary | ICD-10-CM | POA: Diagnosis present

## 2019-11-08 DIAGNOSIS — Z9889 Other specified postprocedural states: Secondary | ICD-10-CM

## 2019-11-08 DIAGNOSIS — B9562 Methicillin resistant Staphylococcus aureus infection as the cause of diseases classified elsewhere: Secondary | ICD-10-CM

## 2019-11-08 DIAGNOSIS — A4102 Sepsis due to Methicillin resistant Staphylococcus aureus: Secondary | ICD-10-CM

## 2019-11-08 DIAGNOSIS — Z86718 Personal history of other venous thrombosis and embolism: Secondary | ICD-10-CM

## 2019-11-08 DIAGNOSIS — D696 Thrombocytopenia, unspecified: Secondary | ICD-10-CM

## 2019-11-08 DIAGNOSIS — F17210 Nicotine dependence, cigarettes, uncomplicated: Secondary | ICD-10-CM

## 2019-11-08 DIAGNOSIS — R6520 Severe sepsis without septic shock: Secondary | ICD-10-CM

## 2019-11-08 DIAGNOSIS — R7881 Bacteremia: Secondary | ICD-10-CM | POA: Diagnosis present

## 2019-11-08 DIAGNOSIS — D649 Anemia, unspecified: Secondary | ICD-10-CM | POA: Diagnosis present

## 2019-11-08 DIAGNOSIS — I2102 ST elevation (STEMI) myocardial infarction involving left anterior descending coronary artery: Secondary | ICD-10-CM

## 2019-11-08 DIAGNOSIS — K7460 Unspecified cirrhosis of liver: Secondary | ICD-10-CM | POA: Diagnosis present

## 2019-11-08 LAB — BLOOD CULTURE ID PANEL (REFLEXED) - BCID2

## 2019-11-08 LAB — BASIC METABOLIC PANEL
Anion gap: 17 — ABNORMAL HIGH (ref 5–15)
Anion gap: 21 — ABNORMAL HIGH (ref 5–15)
BUN: 66 mg/dL — ABNORMAL HIGH (ref 6–20)
BUN: 66 mg/dL — ABNORMAL HIGH (ref 6–20)
CO2: 17 mmol/L — ABNORMAL LOW (ref 22–32)
CO2: 14 mmol/L — ABNORMAL LOW (ref 22–32)
Calcium: 6.3 mg/dL — CL (ref 8.9–10.3)
Calcium: 6.4 mg/dL — CL (ref 8.9–10.3)
Chloride: 101 mmol/L (ref 98–111)
Chloride: 101 mmol/L (ref 98–111)
Creatinine, Ser: 2.81 mg/dL — ABNORMAL HIGH (ref 0.61–1.24)
Creatinine, Ser: 2.51 mg/dL — ABNORMAL HIGH (ref 0.61–1.24)
GFR calc Af Amer: 28 mL/min — ABNORMAL LOW (ref >60)
GFR calc Af Amer: 32 mL/min — ABNORMAL LOW (ref >60)
GFR calc non Af Amer: 24 mL/min — ABNORMAL LOW (ref >60)
GFR calc non Af Amer: 27 mL/min — ABNORMAL LOW (ref >60)
Glucose, Bld: 95 mg/dL (ref 70–99)
Glucose, Bld: 118 mg/dL — ABNORMAL HIGH (ref 70–99)
Potassium: 3.3 mmol/L — ABNORMAL LOW (ref 3.5–5.1)
Potassium: 3.5 mmol/L (ref 3.5–5.1)
Sodium: 135 mmol/L (ref 135–145)
Sodium: 136 mmol/L (ref 135–145)

## 2019-11-08 LAB — POCT I-STAT 7, (LYTES, BLD GAS, ICA,H+H)
Acid-base deficit: 7 mmol/L — ABNORMAL HIGH (ref 0.0–2.0)
Acid-base deficit: 6 mmol/L — ABNORMAL HIGH (ref 0.0–2.0)
Acid-base deficit: 12 mmol/L — ABNORMAL HIGH (ref 0.0–2.0)
Bicarbonate: 14 mmol/L — ABNORMAL LOW (ref 20.0–28.0)
Bicarbonate: 18.7 mmol/L — ABNORMAL LOW (ref 20.0–28.0)
Bicarbonate: 11.8 mmol/L — ABNORMAL LOW (ref 20.0–28.0)
Calcium, Ion: 0.89 mmol/L — CL (ref 1.15–1.40)
Calcium, Ion: 0.9 mmol/L — ABNORMAL LOW (ref 1.15–1.40)
Calcium, Ion: 0.9 mmol/L — ABNORMAL LOW (ref 1.15–1.40)
HCT: 38 % — ABNORMAL LOW (ref 39.0–52.0)
HCT: 34 % — ABNORMAL LOW (ref 39.0–52.0)
HCT: 36 % — ABNORMAL LOW (ref 39.0–52.0)
Hemoglobin: 12.9 g/dL — ABNORMAL LOW (ref 13.0–17.0)
Hemoglobin: 11.6 g/dL — ABNORMAL LOW (ref 13.0–17.0)
Hemoglobin: 12.2 g/dL — ABNORMAL LOW (ref 13.0–17.0)
O2 Saturation: 93 %
O2 Saturation: 100 %
O2 Saturation: 97 %
Patient temperature: 38.7
Patient temperature: 37.7
Patient temperature: 37.6
Potassium: 3.2 mmol/L — ABNORMAL LOW (ref 3.5–5.1)
Potassium: 3.2 mmol/L — ABNORMAL LOW (ref 3.5–5.1)
Potassium: 3.6 mmol/L (ref 3.5–5.1)
Sodium: 140 mmol/L (ref 135–145)
Sodium: 141 mmol/L (ref 135–145)
Sodium: 140 mmol/L (ref 135–145)
TCO2: 15 mmol/L — ABNORMAL LOW (ref 22–32)
TCO2: 20 mmol/L — ABNORMAL LOW (ref 22–32)
TCO2: 12 mmol/L — ABNORMAL LOW (ref 22–32)
pCO2 arterial: 19.9 mmHg — CL (ref 32.0–48.0)
pCO2 arterial: 36.4 mmHg (ref 32.0–48.0)
pCO2 arterial: 23.1 mmHg — ABNORMAL LOW (ref 32.0–48.0)
pH, Arterial: 7.463 — ABNORMAL HIGH (ref 7.350–7.450)
pH, Arterial: 7.323 — ABNORMAL LOW (ref 7.350–7.450)
pH, Arterial: 7.317 — ABNORMAL LOW (ref 7.350–7.450)
pO2, Arterial: 65 mmHg — ABNORMAL LOW (ref 83.0–108.0)
pO2, Arterial: 426 mmHg — ABNORMAL HIGH (ref 83.0–108.0)
pO2, Arterial: 98 mmHg (ref 83.0–108.0)

## 2019-11-08 LAB — URINALYSIS, ROUTINE W REFLEX MICROSCOPIC
Bilirubin Urine: NEGATIVE
Glucose, UA: NEGATIVE mg/dL
Ketones, ur: NEGATIVE mg/dL
Leukocytes,Ua: NEGATIVE
Nitrite: NEGATIVE
Protein, ur: 100 mg/dL — AB
Specific Gravity, Urine: 1.024 (ref 1.005–1.030)
pH: 5 (ref 5.0–8.0)

## 2019-11-08 LAB — CBC
HCT: 37.4 % — ABNORMAL LOW (ref 39.0–52.0)
Hemoglobin: 11.9 g/dL — ABNORMAL LOW (ref 13.0–17.0)
MCH: 27.7 pg (ref 26.0–34.0)
MCHC: 31.8 g/dL (ref 30.0–36.0)
MCV: 87.2 fL (ref 80.0–100.0)
Platelets: 27 10*3/uL — CL (ref 150–400)
RBC: 4.29 MIL/uL (ref 4.22–5.81)
RDW: 20.3 % — ABNORMAL HIGH (ref 11.5–15.5)
WBC: 22.5 10*3/uL — ABNORMAL HIGH (ref 4.0–10.5)
nRBC: 0 % (ref 0.0–0.2)

## 2019-11-08 LAB — COOXEMETRY PANEL
Carboxyhemoglobin: 1.2 % (ref 0.5–1.5)
Methemoglobin: 1.1 % (ref 0.0–1.5)
O2 Saturation: 69 %
Total hemoglobin: 11.5 g/dL — ABNORMAL LOW (ref 12.0–16.0)

## 2019-11-08 LAB — ECHOCARDIOGRAM COMPLETE
AR max vel: 2.85 cm2
AV Area VTI: 2.61 cm2
AV Area mean vel: 2.65 cm2
AV Mean grad: 3.3 mmHg
AV Peak grad: 5.9 mmHg
Ao pk vel: 1.21 m/s
Height: 74 in
Weight: 2560 oz

## 2019-11-08 LAB — LACTIC ACID, PLASMA
Lactic Acid, Venous: 2.4 mmol/L (ref 0.5–1.9)
Lactic Acid, Venous: >11 mmol/L (ref 0.5–1.9)

## 2019-11-08 LAB — GLUCOSE, CAPILLARY
Glucose-Capillary: 117 mg/dL — ABNORMAL HIGH (ref 70–99)
Glucose-Capillary: 105 mg/dL — ABNORMAL HIGH (ref 70–99)

## 2019-11-08 LAB — PROCALCITONIN: Procalcitonin: 33.94 ng/mL

## 2019-11-08 LAB — AMMONIA: Ammonia: 50 umol/L — ABNORMAL HIGH (ref 9–35)

## 2019-11-08 LAB — MAGNESIUM: Magnesium: 2.6 mg/dL — ABNORMAL HIGH (ref 1.7–2.4)

## 2019-11-08 LAB — HIV ANTIBODY (ROUTINE TESTING W REFLEX): HIV Screen 4th Generation wRfx: NONREACTIVE

## 2019-11-08 MED ORDER — HYDROCORTISONE NA SUCCINATE PF 100 MG IJ SOLR
50.0000 mg | Freq: Four times a day (QID) | INTRAMUSCULAR | Status: DC
  Administered 2019-11-08 – 2019-11-10 (×8): 50 mg via INTRAVENOUS
  Filled 2019-11-08 (×8): qty 2

## 2019-11-08 MED ORDER — ETOMIDATE 2 MG/ML IV SOLN
20.0000 mg | Freq: Once | INTRAVENOUS | Status: AC
  Administered 2019-11-08: 20 mg via INTRAVENOUS

## 2019-11-08 MED ORDER — CHLORHEXIDINE GLUCONATE 0.12% ORAL RINSE (MEDLINE KIT)
15.0000 mL | Freq: Two times a day (BID) | OROMUCOSAL | Status: DC
  Administered 2019-11-08 – 2019-11-10 (×4): 15 mL via OROMUCOSAL

## 2019-11-08 MED ORDER — CALCIUM GLUCONATE-NACL 1-0.675 GM/50ML-% IV SOLN
1.0000 g | Freq: Once | INTRAVENOUS | Status: AC
  Administered 2019-11-08: 1000 mg via INTRAVENOUS

## 2019-11-08 MED ORDER — NOREPINEPHRINE 16 MG/250ML-% IV SOLN
0.0000 ug/min | INTRAVENOUS | Status: DC
  Administered 2019-11-08: 20 ug/min via INTRAVENOUS
  Administered 2019-11-08: 40 ug/min via INTRAVENOUS
  Administered 2019-11-09: 58 ug/min via INTRAVENOUS
  Administered 2019-11-09 (×4): 60 ug/min via INTRAVENOUS
  Administered 2019-11-10: 34 ug/min via INTRAVENOUS
  Administered 2019-11-10: 40 ug/min via INTRAVENOUS
  Administered 2019-11-10: 35 ug/min via INTRAVENOUS
  Filled 2019-11-08 (×10): qty 250

## 2019-11-08 MED ORDER — PERFLUTREN LIPID MICROSPHERE
1.0000 mL | INTRAVENOUS | Status: DC | PRN
  Administered 2019-11-08: 3 mL via INTRAVENOUS
  Filled 2019-11-08: qty 10

## 2019-11-08 MED ORDER — ALBUMIN HUMAN 5 % IV SOLN
INTRAVENOUS | Status: AC
  Filled 2019-11-08: qty 250

## 2019-11-08 MED ORDER — ORAL CARE MOUTH RINSE
15.0000 mL | OROMUCOSAL | Status: DC
  Administered 2019-11-08 – 2019-11-10 (×18): 15 mL via OROMUCOSAL

## 2019-11-08 MED ORDER — POLYETHYLENE GLYCOL 3350 17 G PO PACK
17.0000 g | PACK | Freq: Every day | ORAL | Status: DC
  Administered 2019-11-09 – 2019-11-10 (×2): 17 g via ORAL
  Filled 2019-11-08 (×2): qty 1

## 2019-11-08 MED ORDER — POTASSIUM CHLORIDE 10 MEQ/50ML IV SOLN
10.0000 meq | INTRAVENOUS | Status: AC
  Administered 2019-11-08 (×3): 10 meq via INTRAVENOUS
  Filled 2019-11-08 (×3): qty 50

## 2019-11-08 MED ORDER — ALBUMIN HUMAN 25 % IV SOLN
25.0000 g | Freq: Once | INTRAVENOUS | Status: AC
  Administered 2019-11-08: 25 g via INTRAVENOUS
  Filled 2019-11-08: qty 100

## 2019-11-08 MED ORDER — VASOPRESSIN 20 UNITS/100 ML INFUSION FOR SHOCK
0.0000 [IU]/min | INTRAVENOUS | Status: DC
  Administered 2019-11-08: 0.03 [IU]/min via INTRAVENOUS
  Administered 2019-11-09: 0.04 [IU]/min via INTRAVENOUS
  Administered 2019-11-09: 0.03 [IU]/min via INTRAVENOUS
  Administered 2019-11-09 – 2019-11-10 (×3): 0.04 [IU]/min via INTRAVENOUS
  Filled 2019-11-08 (×7): qty 100

## 2019-11-08 MED ORDER — PNEUMOCOCCAL VAC POLYVALENT 25 MCG/0.5ML IJ INJ: 0.5000 mL | INJECTION | INTRAMUSCULAR | Status: DC | PRN

## 2019-11-08 MED ORDER — ROCURONIUM BROMIDE 50 MG/5ML IV SOLN
60.0000 mg | Freq: Once | INTRAVENOUS | Status: AC
  Administered 2019-11-08: 60 mg via INTRAVENOUS
  Filled 2019-11-08: qty 6

## 2019-11-08 MED ORDER — PIPERACILLIN-TAZOBACTAM 3.375 G IVPB
3.3750 g | Freq: Three times a day (TID) | INTRAVENOUS | Status: DC
  Administered 2019-11-08: 3.375 g via INTRAVENOUS
  Filled 2019-11-08: qty 50

## 2019-11-08 MED ORDER — SODIUM CHLORIDE 0.9 % IV SOLN: INTRAVENOUS | Status: DC

## 2019-11-08 MED ORDER — FENTANYL BOLUS VIA INFUSION
50.0000 ug | INTRAVENOUS | Status: DC | PRN
  Administered 2019-11-09 – 2019-11-10 (×4): 50 ug via INTRAVENOUS
  Filled 2019-11-08: qty 50

## 2019-11-08 MED ORDER — ACETAMINOPHEN 650 MG RE SUPP
650.0000 mg | Freq: Four times a day (QID) | RECTAL | Status: DC | PRN
  Administered 2019-11-08 – 2019-11-09 (×2): 650 mg via RECTAL
  Filled 2019-11-08 (×2): qty 1

## 2019-11-08 MED ORDER — CALCIUM GLUCONATE-NACL 1-0.675 GM/50ML-% IV SOLN
INTRAVENOUS | Status: AC
  Filled 2019-11-08: qty 50

## 2019-11-08 MED ORDER — ENSURE ENLIVE PO LIQD
237.0000 mL | Freq: Two times a day (BID) | ORAL | Status: DC
  Administered 2019-11-08: 237 mL via ORAL

## 2019-11-08 MED ORDER — VANCOMYCIN HCL 1250 MG/250ML IV SOLN: 1250.0000 mg | INTRAVENOUS | Status: DC

## 2019-11-08 MED ORDER — PANTOPRAZOLE SODIUM 40 MG IV SOLR
40.0000 mg | INTRAVENOUS | Status: DC
  Administered 2019-11-08 – 2019-11-09 (×2): 40 mg via INTRAVENOUS
  Filled 2019-11-08 (×2): qty 40

## 2019-11-08 MED ORDER — FENTANYL CITRATE (PF) 100 MCG/2ML IJ SOLN
INTRAMUSCULAR | Status: AC
  Administered 2019-11-08: 100 ug
  Filled 2019-11-08: qty 2

## 2019-11-08 MED ORDER — DEXMEDETOMIDINE HCL IN NACL 400 MCG/100ML IV SOLN
0.0000 ug/kg/h | INTRAVENOUS | Status: DC
  Administered 2019-11-08: 0.5 ug/kg/h via INTRAVENOUS
  Administered 2019-11-09: 0.4 ug/kg/h via INTRAVENOUS
  Filled 2019-11-08 (×2): qty 100

## 2019-11-08 MED ORDER — LACTATED RINGERS IV BOLUS
1000.0000 mL | Freq: Once | INTRAVENOUS | Status: AC
  Administered 2019-11-08: 1000 mL via INTRAVENOUS

## 2019-11-08 MED ORDER — SODIUM BICARBONATE 8.4 % IV SOLN
INTRAVENOUS | Status: AC
  Administered 2019-11-08: 50 meq
  Filled 2019-11-08: qty 50

## 2019-11-08 MED ORDER — FOLIC ACID 5 MG/ML IJ SOLN
1.0000 mg | Freq: Every day | INTRAMUSCULAR | Status: DC
  Administered 2019-11-08: 1 mg via INTRAVENOUS
  Filled 2019-11-08 (×5): qty 0.2

## 2019-11-08 MED ORDER — SODIUM BICARBONATE 8.4 % IV SOLN
INTRAVENOUS | Status: AC
  Filled 2019-11-08: qty 50

## 2019-11-08 MED ORDER — VANCOMYCIN HCL 1500 MG/300ML IV SOLN
1500.0000 mg | Freq: Once | INTRAVENOUS | Status: AC
  Administered 2019-11-08: 1500 mg via INTRAVENOUS
  Filled 2019-11-08: qty 300

## 2019-11-08 MED ORDER — SODIUM BICARBONATE-DEXTROSE 150-5 MEQ/L-% IV SOLN
150.0000 meq | INTRAVENOUS | Status: DC
  Filled 2019-11-08: qty 1000

## 2019-11-08 MED ORDER — SODIUM BICARBONATE-DEXTROSE 150-5 MEQ/L-% IV SOLN
150.0000 meq | INTRAVENOUS | Status: DC
  Administered 2019-11-09 – 2019-11-10 (×4): 150 meq via INTRAVENOUS
  Filled 2019-11-08 (×8): qty 1000

## 2019-11-08 MED ORDER — DOCUSATE SODIUM 50 MG/5ML PO LIQD
100.0000 mg | Freq: Two times a day (BID) | ORAL | Status: DC
  Administered 2019-11-08 – 2019-11-09 (×3): 100 mg via ORAL
  Filled 2019-11-08 (×4): qty 10

## 2019-11-08 MED ORDER — MIDAZOLAM HCL 2 MG/2ML IJ SOLN
INTRAMUSCULAR | Status: AC
  Administered 2019-11-08: 2 mg
  Filled 2019-11-08: qty 2

## 2019-11-08 MED ORDER — FENTANYL CITRATE (PF) 100 MCG/2ML IJ SOLN: 50.0000 ug | Freq: Once | INTRAMUSCULAR | Status: AC

## 2019-11-08 MED ORDER — ADULT MULTIVITAMIN LIQUID CH
15.0000 mL | Freq: Every day | ORAL | Status: DC
  Administered 2019-11-08 – 2019-11-10 (×3): 15 mL via ORAL
  Filled 2019-11-08 (×3): qty 15

## 2019-11-08 MED ORDER — SODIUM BICARBONATE-DEXTROSE 150-5 MEQ/L-% IV SOLN
150.0000 meq | INTRAVENOUS | Status: AC
  Administered 2019-11-08: 150 meq via INTRAVENOUS
  Filled 2019-11-08 (×2): qty 1000

## 2019-11-08 MED ORDER — INSULIN ASPART 100 UNIT/ML ~~LOC~~ SOLN
1.0000 [IU] | SUBCUTANEOUS | Status: DC
  Administered 2019-11-09: 2 [IU] via SUBCUTANEOUS
  Administered 2019-11-09 (×2): 1 [IU] via SUBCUTANEOUS
  Administered 2019-11-09: 2 [IU] via SUBCUTANEOUS
  Administered 2019-11-10: 3 [IU] via SUBCUTANEOUS
  Administered 2019-11-10: 2 [IU] via SUBCUTANEOUS

## 2019-11-08 MED ORDER — FENTANYL 2500MCG IN NS 250ML (10MCG/ML) PREMIX INFUSION
50.0000 ug/h | INTRAVENOUS | Status: DC
  Filled 2019-11-08 (×3): qty 250

## 2019-11-08 MED ORDER — SODIUM CHLORIDE 0.9 % IV BOLUS
250.0000 mL | Freq: Once | INTRAVENOUS | Status: AC
  Administered 2019-11-08: 250 mL via INTRAVENOUS

## 2019-11-08 NOTE — Procedures (Signed)
Central Venous Catheter Insertion Procedure Note  DEQUINCY BORN  [id number]  November 25, 1960  Date:11/08/19  Time:4:17 PM   Provider Performing:Timber Marshman Erby Pian   Procedure: Insertion of Non-tunneled Central Venous Catheter(36556) with US guidance (74142)   Indication(s) Medication administration  Consent Unable to obtain consent due to emergent nature of procedure.  Anesthesia Topical only with 1% lidocaine   Timeout Verified patient identification, verified procedure, site/side was marked, verified correct patient position, special equipment/implants available, medications/allergies/relevant history reviewed, required imaging and test results available.  Sterile Technique Maximal sterile technique including full sterile barrier drape, hand hygiene, sterile gown, sterile gloves, mask, hair covering, sterile ultrasound probe cover (if used).  Procedure Description Area of catheter insertion was cleaned with chlorhexidine and draped in sterile fashion.  With real-time ultrasound guidance a central venous catheter was placed into the left internal jugular vein. Nonpulsatile blood flow and easy flushing noted in all ports.  The catheter was sutured in place and sterile dressing applied.  Complications/Tolerance None; patient tolerated the procedure well. Chest X-ray is ordered to verify placement for internal jugular or subclavian cannulation.   Chest x-ray is not ordered for femoral cannulation.  EBL Minimal  Specimen(s) None

## 2019-11-08 NOTE — Consult Note (Signed)
[facility]    Date of Admission:  2019-11-26           Day 1 vancomycin       Reason for Consult: Automatic consultation for MRSA bacteremia     Assessment: He has MRSA bacteremia and may have left-sided endocarditis causing embolism to the LAD leading to his MI.  This could also possibly explain some decreased movement of his left foot although no acute stroke was noted on brain CT.  I will continue vancomycin and repeat blood cultures in the morning.  I have ordered TTE.  Because of his cirrhosis and thrombocytopenia I will also order HIV and hepatitis C antibodies.  Plan: 1. Continue vancomycin 2. Await results of TTE 3. Repeat blood cultures in a.m. 4. HIV and hepatitis C antibodies  Principal Problem:   MRSA bacteremia Active Problems:   Septic shock (HCC)   ST elevation myocardial infarction involving left anterior descending (LAD) coronary artery (HCC)   Cardiogenic shock (HCC)   ETOH abuse   Thrombocytopenia (HCC)   AKI (acute kidney injury) (HCC)   History of DVT (deep vein thrombosis)   Cigarette smoker   Normocytic anemia   S/P thoracotomy   Cirrhosis (HCC)   Scheduled Meds: . aspirin  81 mg Oral Daily  . atorvastatin  80 mg Oral Daily  . Chlorhexidine Gluconate Cloth  6 each Topical Daily  . docusate  100 mg Oral BID  . feeding supplement (ENSURE ENLIVE)  237 mL Oral BID BM  . fentaNYL (SUBLIMAZE) injection  50 mcg Intravenous Once  . folic acid  1 mg Intravenous Daily  . hydrocortisone sod succinate (SOLU-CORTEF) inj  50 mg Intravenous Q6H  . mouth rinse  15 mL Mouth Rinse BID  . multivitamin  15 mL Oral Daily  . mupirocin ointment  1 application Nasal BID  . pantoprazole (PROTONIX) IV  40 mg Intravenous Q24H  . [START ON 11/09/2019] polyethylene glycol  17 g Oral Daily  . sodium chloride flush  10-40 mL Intracatheter Q12H  . sodium chloride flush  3 mL Intravenous Q12H  . thiamine  100 mg Oral Daily   Or  .  thiamine  100 mg Intravenous Daily   Continuous Infusions: . sodium chloride    . sodium chloride Stopped (11/08/19 0754)  . sodium chloride    . dexmedetomidine (PRECEDEX) IV infusion    . fentaNYL infusion INTRAVENOUS    . lactated ringers    . norepinephrine (LEVOPHED) Adult infusion 55 mcg/min (11/08/19 1600)  . potassium chloride 50 mL/hr at 11/08/19 1600  . sodium bicarbonate 150 mEq in dextrose 5% 1000 mL 100 mL/hr at 11/08/19 1600  . [START ON 11/30/2019] vancomycin    . vancomycin 150 mL/hr at 11/08/19 1600  . vasopressin     PRN Meds:.sodium chloride, sodium chloride, acetaminophen, acetaminophen, diazepam, fentaNYL, LORazepam **OR** LORazepam, ondansetron (ZOFRAN) IV, perflutren lipid microspheres (DEFINITY) IV suspension, pneumococcal 23 valent vaccine, sodium chloride flush, sodium chloride flush  HPI: Karl Alvarado is a 59 y.o. male with a history of heavy alcohol use, cigarette smoking and previous DVT.  According to his mother he had not been feeling well for the past week.  He developed substernal chest pain yesterday leading to admission.  He was febrile and found to have ST elevation in the inferior and lateral leads.  He was taken emergently to the Cath Lab where he was found to have a  thrombus in the ostium of the LAD.  He was severely hypotensive.  An aortic balloon pump was placed.  Admission blood cultures are growing MRSA.   Review of Systems: Review of Systems  Unable to perform ROS: Intubated    Past Medical History:  Diagnosis Date  . Hypertension   . Seizures (HCC)     Social History   Tobacco Use  . Smoking status: Current Every Day Smoker    Packs/day: 0.00    Types: Cigars  . Smokeless tobacco: Never Used  Substance Use Topics  . Alcohol use: No    Comment: used to be a heavy drinker  . Drug use: No    Family History  Problem Relation Age of Onset  . Diabetes Mother   . Prostate cancer Father    Allergies  Allergen Reactions  .  Penicillins Hives and Rash    Rash  Has patient had a PCN reaction causing immediate rash, facial/tongue/throat swelling, SOB or lightheadedness with hypotension: YES Has patient had a PCN reaction causing severe rash involving mucus membranes or skin necrosis: NO Has patient had a PCN reaction that required hospitalizationNO Has patient had a PCN reaction occurring within the last 10 years: NO If all of the above answers are "NO", then may proceed with Cephalosporin use.    OBJECTIVE: Blood pressure 99/69, pulse (!) 103, temperature (!) 100.4 F (38 C), temperature source Core, resp. rate (!) 22, height 6\' 2"  (1.88 m), weight 72.6 kg, SpO2 100 %.  Physical Exam Constitutional:      Comments: He is sedated.  He was just intubated.  Eyes:     Conjunctiva/sclera: Conjunctivae normal.     Comments: No splinter or conjunctival hemorrhages noted.  Cardiovascular:     Rate and Rhythm: Normal rate and regular rhythm.     Comments: Distant heart sounds.  No murmur heard. Pulmonary:     Breath sounds: Normal breath sounds.  Abdominal:     General: There is no distension.     Palpations: Abdomen is soft.  Musculoskeletal:        General: No swelling or tenderness.  Skin:    Findings: Bruising present. No rash.     Comments: He has a scabbed area on his right shin.  Neurological:     Comments: His nurse noted that before intubation he was alert and following commands but was not moving his left foot as much is his other extremities.     Lab Results Lab Results  Component Value Date   WBC 22.5 (H) 11/08/2019   HGB 12.9 (L) 11/08/2019   HCT 38.0 (L) 11/08/2019   MCV 87.2 11/08/2019   PLT 27 (LL) 11/08/2019    Lab Results  Component Value Date   CREATININE 2.81 (H) 11/08/2019   BUN 66 (H) 11/08/2019   NA 140 11/08/2019   K 3.2 (L) 11/08/2019   CL 101 11/08/2019   CO2 17 (L) 11/08/2019    Lab Results  Component Value Date   ALT 69 (H) 11/06/2019   AST 432 (H) 11/06/2019    ALKPHOS 80 11/29/2019   BILITOT 2.0 (H) 11/22/2019     Microbiology: Recent Results (from the past 240 hour(s))  SARS Coronavirus 2 by RT PCR (hospital order, performed in [facility] hospital lab) Nasopharyngeal Nasopharyngeal Swab     Status: None   Collection Time: 11/06/2019  1:15 PM   Specimen: Nasopharyngeal Swab  Result Value Ref Range Status   SARS Coronavirus 2 NEGATIVE NEGATIVE  Final    Comment: (NOTE) SARS-CoV-2 target nucleic acids are NOT DETECTED.  The SARS-CoV-2 RNA is generally detectable in upper and lower respiratory specimens during the acute phase of infection. The lowest concentration of SARS-CoV-2 viral copies this assay can detect is 250 copies / mL. A negative result does not preclude SARS-CoV-2 infection and should not be used as the sole basis for treatment or other patient management decisions.  A negative result may occur with improper specimen collection / handling, submission of specimen other than nasopharyngeal swab, presence of viral mutation(s) within the areas targeted by this assay, and inadequate number of viral copies (<250 copies / mL). A negative result must be combined with clinical observations, patient history, and epidemiological information.  Fact Sheet for Patients:   BoilerBrush.com.cy  Fact Sheet for Healthcare Providers: https://pope.com/  This test is not yet approved or  cleared by the [place] FDA and has been authorized for detection and/or diagnosis of SARS-CoV-2 by FDA under an Emergency Use Authorization (EUA).  This EUA will remain in effect (meaning this test can be used) for the duration of the COVID-19 declaration under Section 564(b)(1) of the Act, 21 U.S.C. section 360bbb-3(b)(1), unless the authorization is terminated or revoked sooner.  Performed at [facility], [address], [place], [place] [address]   MRSA PCR Screening     Status: Abnormal    Collection Time: 11/17/2019  7:24 PM   Specimen: Urine, Unspecified Source; Nasopharyngeal  Result Value Ref Range Status   MRSA by PCR POSITIVE (A) NEGATIVE Final    Comment:        The GeneXpert MRSA Assay (FDA approved for NASAL specimens only), is one component of a comprehensive MRSA colonization surveillance program. It is not intended to diagnose MRSA infection nor to guide or monitor treatment for MRSA infections. RESULT CALLED TO, READ BACK BY AND VERIFIED WITH: [facility] RN 2019/11/17 AT 2109 SKEEN,P Performed at [facility], [address], [place], [place] [address]   Culture, blood (routine x 2)     Status: None (Preliminary result)   Collection Time: 11/08/19  1:02 AM   Specimen: BLOOD  Result Value Ref Range Status   Specimen Description BLOOD LEFT ANTECUBITAL  Final   Special Requests   Final    BOTTLES DRAWN AEROBIC AND ANAEROBIC Blood Culture adequate volume   Culture  Setup Time   Final    GRAM POSITIVE COCCI IN CLUSTERS IN BOTH AEROBIC AND ANAEROBIC BOTTLES CRITICAL RESULT CALLED TO, READ BACK BY AND VERIFIED WITH: PHARMD T DANG [id number] AT 1502 BY CM Performed at [facility], [address], [place], [place] [address]    Culture GRAM POSITIVE COCCI  Final   Report Status PENDING  Incomplete  Blood Culture ID Panel (Reflexed)     Status: Abnormal   Collection Time: 11/08/19  1:02 AM  Result Value Ref Range Status   Enterococcus faecalis NOT DETECTED NOT DETECTED Final   Enterococcus Faecium NOT DETECTED NOT DETECTED Final   Listeria monocytogenes NOT DETECTED NOT DETECTED Final   Staphylococcus species DETECTED (A) NOT DETECTED Final    Comment: CRITICAL RESULT CALLED TO, READ BACK BY AND VERIFIED WITH: PHARMD T DANG [id number] AT 1502 BY CM    Staphylococcus aureus (BCID) DETECTED (A) NOT DETECTED Final    Comment: Methicillin (oxacillin)-resistant Staphylococcus aureus (MRSA). MRSA is predictably resistant to beta-lactam antibiotics (except  ceftaroline). Preferred therapy is vancomycin unless clinically contraindicated. Patient requires contact precautions  if  hospitalized. CRITICAL RESULT CALLED TO, READ BACK BY AND VERIFIED WITH: PHARMD T DANG [id number] AT 1502 BY CM    Staphylococcus epidermidis NOT DETECTED NOT DETECTED Final   Staphylococcus lugdunensis NOT DETECTED NOT DETECTED Final   Streptococcus species NOT DETECTED NOT DETECTED Final   Streptococcus agalactiae NOT DETECTED NOT DETECTED Final   Streptococcus pneumoniae NOT DETECTED NOT DETECTED Final   Streptococcus pyogenes NOT DETECTED NOT DETECTED Final   A.calcoaceticus-baumannii NOT DETECTED NOT DETECTED Final   Bacteroides fragilis NOT DETECTED NOT DETECTED Final   Enterobacterales NOT DETECTED NOT DETECTED Final   Enterobacter cloacae complex NOT DETECTED NOT DETECTED Final   Escherichia coli NOT DETECTED NOT DETECTED Final   Klebsiella aerogenes NOT DETECTED NOT DETECTED Final   Klebsiella oxytoca NOT DETECTED NOT DETECTED Final   Klebsiella pneumoniae NOT DETECTED NOT DETECTED Final   Proteus species NOT DETECTED NOT DETECTED Final   Salmonella species NOT DETECTED NOT DETECTED Final   Serratia marcescens NOT DETECTED NOT DETECTED Final   Haemophilus influenzae NOT DETECTED NOT DETECTED Final   Neisseria meningitidis NOT DETECTED NOT DETECTED Final   Pseudomonas aeruginosa NOT DETECTED NOT DETECTED Final   Stenotrophomonas maltophilia NOT DETECTED NOT DETECTED Final   Candida albicans NOT DETECTED NOT DETECTED Final   Candida auris NOT DETECTED NOT DETECTED Final   Candida glabrata NOT DETECTED NOT DETECTED Final   Candida krusei NOT DETECTED NOT DETECTED Final   Candida parapsilosis NOT DETECTED NOT DETECTED Final   Candida tropicalis NOT DETECTED NOT DETECTED Final   Cryptococcus neoformans/gattii NOT DETECTED NOT DETECTED Final   Meth resistant mecA/C and MREJ DETECTED (A) NOT DETECTED Final    Comment: CRITICAL RESULT CALLED TO, READ BACK BY AND  VERIFIED WITH: PHARMD T DANG [id number] AT 1502 BY CM Performed at [facility], [address] [place], [place] [address]   Culture, blood (routine x 2)     Status: None (Preliminary result)   Collection Time: 11/08/19  1:10 AM   Specimen: BLOOD RIGHT HAND  Result Value Ref Range Status   Specimen Description BLOOD RIGHT HAND  Final   Special Requests   Final    BOTTLES DRAWN AEROBIC ONLY Blood Culture results may not be optimal due to an inadequate volume of blood received in culture bottles   Culture  Setup Time   Final    GRAM POSITIVE COCCI IN CLUSTERS AEROBIC BOTTLE ONLY CRITICAL VALUE NOTED.  VALUE IS CONSISTENT WITH PREVIOUSLY REPORTED AND CALLED VALUE. Performed at [facility], [address] [place], [place] [address]    [facility]  Final   Report Status PENDING  Incomplete    Cliffton AstersJohn Dinero Chavira, MD [facility] [phone] pager   [phone] 11/08/2019, 4:39 PM

## 2019-11-08 NOTE — Progress Notes (Signed)
Transported pt to and from CT scan without incident.

## 2019-11-08 NOTE — Progress Notes (Signed)
Pharmacy Antibiotic Note  Karl Alvarado is a 59 y.o. male admitted on November 10, 2019 with STEMI, found to have PLTC 20 and no intervention done, shock NE started now up to 20 and  > IABP placed but no AC with low PLTC.  Tm 101, WBC elevated 2, LA 2.4, PCT 33 concern for sepsis Cr 3.5 on admit slightly improved today 2.5.  Pharmacy has been consulted for vancomyini and Zosyn  dosing.  Plan: Zosyn 3.375gm IV q8h EI Vancomycin 1500mg  IV x1 the 1250mg  q48h Est AUC400  Height: 6\' 2"  (188 cm) Weight: 72.6 kg (160 lb) IBW/kg (Calculated) : 82.2  Temp (24hrs), Avg:100.9 F (38.3 C), Min:100.3 F (37.9 C), Max:101.5 F (38.6 C)  Recent Labs  Lab 11/09/2019 1308 11/18/2019 1401 10-Nov-2019 1809 11/03/2019 2040 11/27/2019 2247 11/08/19 0500  WBC 16.8* 16.5*  --  18.7*  --  22.5*  CREATININE 3.79*  --   --  3.15*  --  2.81*  LATICACIDVEN  --   --  2.8*  --  2.0* 2.4*    Estimated Creatinine Clearance: 29.8 mL/min (A) (by C-G formula based on SCr of 2.81 mg/dL (H)).    Allergies  Allergen Reactions  . Penicillins Hives and Rash    Rash  Has patient had a PCN reaction causing immediate rash, facial/tongue/throat swelling, SOB or lightheadedness with hypotension: YES Has patient had a PCN reaction causing severe rash involving mucus membranes or skin necrosis: NO Has patient had a PCN reaction that required hospitalizationNO Has patient had a PCN reaction occurring within the last 10 years: NO If all of the above answers are "NO", then may proceed with Cephalosporin use.    Antimicrobials this admission: 8/8 vanc  8/8 zosyn Dose adjustments this admission:   Microbiology results: 8/8 BCx: ip  MRSA PCR: +  01/08/20 Pharm.D. CPP, BCPS Clinical Pharmacist [phone] 11/08/2019 1:44 PM

## 2019-11-08 NOTE — Progress Notes (Addendum)
Called [facility] due to pt's blood pressure sustaining in low 60s on IABP. Bicarb push increased MAP to 70s but has steadily decreased again. Last ABG at 1700. Labs still pending. MD ordered another ABG and turn IABP 1:1.  Pt did not tolerate 1:1 and was changed back to 1:2

## 2019-11-08 NOTE — Progress Notes (Signed)
Paged cardiologist on call to notify MD regarding pt's sustained BP in MAP of low 60s, maxed on levo gtt.  MD reports to call if MAP drops and sustains in the 50s. No orders given at this time.

## 2019-11-08 NOTE — Progress Notes (Signed)
11/08/2019  Called back for worsening hemodynamics and resp status. Nasal flaring, RR in 30s, gurgling breath sounds. Now growing MRSA in blood. Levophed drip has doubled. POCUS showing hyperdynamic heart, small pericardial effusion  A: MRSA septic shock, question septic thrombus causing STEMI EtOH cirrhosis Acute renal injury  P: - Intubate, central line - Vanc, zosyn d/c'd by ID - Stress steroids, add vasopressin, not sure he needs balloon pump - Maybe get IABP out soon  40 minutes additional cc time not including any separately billable procedures  Myrla Halsted MD PCCM

## 2019-11-08 NOTE — Progress Notes (Signed)
Notified Elink regearding pt's lateset potasium of 3.2 on last ABG, and calcium of 6.3 per AM labs. Pt's blood pressure also dips down into MAP of low 60s, systolic in the 80s, not meeting medication parameters.   Awaiting further orders.

## 2019-11-08 NOTE — Progress Notes (Signed)
Pt intubated by Dr. Katrinka Blazing in 2H25.  2mg  Versed, 100mg  Fentanyl, 20 Etomidate, 60mg  Rocuromium given during intubation. Pt tolerated procedure well.  Left IJ placed at this time. Pt tolerated well.

## 2019-11-08 NOTE — Progress Notes (Signed)
Notified MD of core temperature of 101.5, will decrease temp in room and apply ice packs. Blood cultures ordered.   Increased pressor requirements, SBP in 70s IABP pressures, cuff pressures, and monitor arterial pressures are not correlating. Will titrate levophed using IABP pressure. IABP changed from 1:2 to 1:1 per Dr Liane Comber.   Since admit to unit, around 1400, patient has not voided. Bladder scan 218. No interventions at this time.

## 2019-11-08 NOTE — Progress Notes (Signed)
Progress Note   Subjective   Mild chest pain today.  + febrile,  Minor chest congestion, hypotensive requiring levophed  Nursing has noticed reduced movement of his L foot.  Inpatient Medications    Scheduled Meds: . aspirin  81 mg Oral Daily  . atorvastatin  80 mg Oral Daily  . Chlorhexidine Gluconate Cloth  6 each Topical Daily  . folic acid  1 mg Intravenous Daily  . mouth rinse  15 mL Mouth Rinse BID  . multivitamin  15 mL Oral Daily  . mupirocin ointment  1 application Nasal BID  . sodium chloride flush  10-40 mL Intracatheter Q12H  . sodium chloride flush  3 mL Intravenous Q12H  . thiamine  100 mg Oral Daily   Or  . thiamine  100 mg Intravenous Daily   Continuous Infusions: . sodium chloride    . sodium chloride Stopped (11/08/19 0754)  . sodium chloride    . sodium chloride 50 mL/hr at 11/08/19 0912  . norepinephrine (LEVOPHED) Adult infusion 20 mcg/min (11/08/19 0900)   PRN Meds: sodium chloride, sodium chloride, acetaminophen, diazepam, LORazepam **OR** LORazepam, ondansetron (ZOFRAN) IV, sodium chloride flush, sodium chloride flush   Vital Signs    Vitals:   11/08/19 0725 11/08/19 0755 11/08/19 0800 11/08/19 0900  BP:  90/72 (!) 103/23 91/77  Pulse: (!) 119 (!) 117 (!) 119 (!) 117  Resp: (!) 23  (!) 25 (!) 22  Temp: (!) 100.9 F (38.3 C)  (!) 100.8 F (38.2 C) (!) 100.4 F (38 C)  TempSrc:   Core (Comment)   SpO2: 97%  96% 99%  Weight:      Height:        Intake/Output Summary (Last 24 hours) at 11/08/2019 0957 Last data filed at 11/08/2019 0900 Gross per 24 hour  Intake 2299.39 ml  Output 300 ml  Net 1999.39 ml   Filed Weights   11/20/2019 1319  Weight: 72.6 kg    Telemetry    sinus - Personally Reviewed  Physical Exam   GEN- The patient is ill appearing, alert and oriented x 3 today.   Head- normocephalic, atraumatic Eyes-  Sclera clear, conjunctiva pink Ears- hearing intact Oropharynx- clear Neck- supple, Lungs-  normal work of  breathing Heart- Regular rate and rhythm  GI- soft  Extremities- no clubbing, cyanosis, or edema  MS- no significant deformity or atrophy Reduced sensation of his L foot with 2/5 strength to dorsi/plantar flexion Skin- both feet are cool but with good cap refill at the toes bilaterally Psych- euthymic mood, full affect, mild tremor but no other s/sx of withdrawal Neuro- strength and sensation are intact   Labs    Chemistry Recent Labs  Lab 11/06/2019 1308 11/14/2019 2040 11/08/19 0500  NA 133* 132* 135  K 4.2 3.7 3.3*  CL 90* 96* 101  CO2 18* 19* 17*  GLUCOSE 119* 113* 95  BUN 56* 65* 66*  CREATININE 3.79* 3.15* 2.81*  CALCIUM 7.6* 6.9* 6.3*  PROT 6.7 5.7*  --   ALBUMIN 2.2* 1.9*  --   AST 434* 432*  --   ALT 74* 69*  --   ALKPHOS 73 80  --   BILITOT 2.2* 2.0*  --   GFRNONAA 17* 21* 24*  GFRAA 19* 24* 28*  ANIONGAP 25* 17* 17*     Hematology Recent Labs  Lab 11/19/2019 1401 11/12/2019 2040 11/08/19 0500  WBC 16.5* 18.7* 22.5*  RBC 4.03* 4.39 4.29  HGB 11.4* 12.3* 11.9*  HCT 34.9* 37.6* 37.4*  MCV 86.6 85.6 87.2  MCH 28.3 28.0 27.7  MCHC 32.7 32.7 31.8  RDW 19.9* 19.9* 20.3*  PLT 25* 25* 27*     Patient ID  Karl Alvarado is a 59 y.o. male with a history of hypertension, prior DVT, alcohol abuse who presents with inferolateral STEMI.  Assessment & Plan    1.  Inferolateral STEMI Cath showed ostial LAD thrombus in the setting of profound thrombocytopenia with anterolateral to distal inferoapical HK.  EF 35% with cardiogenic shock requiring IABP.  Intervention not performed because of thrombocytopenia and renal failure (Cr 3.79) I have spoken with Dr Tresa Endo this am about the patient at length.  He feels that the patient needs his IABP.  We will try to reduce with 1:2 augmentation per Dr Tresa Endo.  I mentioned concerns for cool extremities and reduced L leg movement.  He does not feel that we can remove IABP at this time.  2. Alcohol abuse Withdrawal progocol in  place  3. Thrombocytopenia Likely chronic Prohibits [place] from using IV heparin/ anticoagulation currently  4. Tobacco Cessation advised  5. Elevated WBC/ fever/ concern for sepsis Blood cultures sent Will consult PCCM for assistance Continue levophed CXR IVF  6. L foot weakness/ numbness I am worried about this finding.  I will obtain a head CT to evaluate for stroke.  Too sick for MRI Not a candidate for heparin Pulses are diminished and feet are cold bilaterally.  Cap refill is preserved and there is no cyanosis.  Cannot remove IABP at this time as above  Critical care time was exclusive of separate billable procedures and treating other patients.  Critial care time was spent personally by me (independant of midlevel providers) on the following activities: development of treatment plan with patient and mother as well as nursing, discussions with Dr Tresa Endo and Brett Canales Minor, evaluation of patients response to treatment, examining patient, obtaining history from patient and mother, ordering/ reviewing treatments/ interventions, lab studies, radiographic studies, pulse ox, and re-evaluation of patients condition.     The patient is critically ill with multiple organ systems failure and requires high complexity decision making for assessment and support, frequent evaluation and titration of therapies, application of advanced monitoring technologies and extensive interpretation of databases.   Critical care was necessary to treat or prevent immintent or life-threatening deterioration.   Total CCT spent directly with the patient today is 60 minutes  Hillis Range MD, [facility] 11/08/2019 11:20 AM

## 2019-11-08 NOTE — Progress Notes (Addendum)
eLink Physician-Brief Progress Note Patient Name: Karl Alvarado DOB: May 25, 1960 MRN: [id number]   Date of Service  11/08/2019  HPI/Events of Note  Talked to [place], Charity fundraiser.  1. Low K. On bicarb gtt. 2. Low ion calcium  3. MAP soft on IABP. On Levo 50, vaso 0.3  Septic emboli/MRSA/cardiogenic shock. S/P STEMI. Alcohol abuse.   eICU Interventions  - get BMP/Mag - calcium gluconate 1 gm stat - albumin once IV. Did not tolerate IABP 1:1 earlier in day, now on 1:2.  - on abx.      Intervention Category Intermediate Interventions: Hypotension - evaluation and management;Electrolyte abnormality - evaluation and management  Ranee Gosselin 11/08/2019, 8:53 PM   21:00 Camera: IABP 1:2, MAP < 60. Wave form ok.  In synchrony.  Cards/ID Rickey Barbara note reviewed again.  dopler both lower extremity feeble, but +. No cyanosis.  Bicarb gtt expiring.  - get ABG - re ordered Bicarb gtt. - already on stress steroids, 2 pressor. Get bladder scan. UOP today 800. EF low. If MAP not getting better, will call bed side team to see him.

## 2019-11-08 NOTE — Consult Note (Addendum)
NAME:  Karl Alvarado, MRN:  [id number], DOB:  [birth year], LOS: 1 ADMISSION DATE:  12/03/2019, CONSULTATION DATE:  11/08/19 REFERRING MD:  Tresa Endo, CHIEF COMPLAINT:  Feels ill   Brief History   59 year old man with heavy etoh abuse history, htn, p/w inferolateral stemi from LAD thrombus unable to intervene due to AKI and severe thrombocytopenia.  IABP placed and patient brought to ICU.   Care complicated by development of sepsis.  History of present illness   59 year old man with heavy etoh abuse history, htn, p/w inferolateral stemi from LAD thrombus unable to intervene due to AKI and severe thrombocytopenia.  IABP placed last night due to low Bps after plt transfusion.   Today's care complicated by development of sepsis (fevers, rising white count) and new left sided weakness.  Currently c/o cough/congestion, chills.  Denies dysuria, N/V/D.  IABP at 1:2.  Plts remain low.  Confused.  Past Medical History  EtOH abuse Prior DVT not currently on [facility] Prior seizures presumed due to EtOH w/d Tobacco abuse Alcoholic malnutrition Question EtOH cirrhosis  Significant Hospital Events   8/7 admitted, LHC, IABP 8/8 fevers  Consults:  N/A  Procedures:  8/7 LHC, IABP  Significant Diagnostic Tests:  CXR hyperinflation  Micro Data:  COVID neg Blood cx x 2 >> UA>>  Antimicrobials:  Vanc 8/8>> Zosyn 8/8>>   Interim history/subjective:  Consulted  Objective   Blood pressure 97/60, pulse (!) 122, temperature (!) 100.4 F (38 C), resp. rate (!) 28, height 6\' 2"  (1.88 m), weight 72.6 kg, SpO2 98 %. PAP: (26-36)/(9-21) 36/15 CVP:  [2 mmHg-10 mmHg] 6 mmHg PCWP:  [17 mmHg] 17 mmHg CO:  [5.2 L/min-5.7 L/min] 5.7 L/min CI:  [2.7 L/min/m2-3 L/min/m2] 3 L/min/m2      Intake/Output Summary (Last 24 hours) at 11/08/2019 1055 Last data filed at 11/08/2019 1017 Gross per 24 hour  Intake 2628.17 ml  Output 300 ml  Net 2328.17 ml   Filed Weights   2019-12-03 1319  Weight: 72.6 kg     Examination: General: confused cachetic man in NAD, mildly tremulous HENT: MM dry, trachea midline, temporal wasting Lungs: scattered rhonci, no accessory muscle use Cardiovascular: tachycardic, ext warm Abdomen: soft, +BS Extremities: muscle wasting Neuro: moves all 4 ext to command, weak, moving L side less than R GU: dark amber urine  WBC 16>>18>>22 CBG okay Na, K low, Cr 23.1>>2.8 Lactate 2>>2.4 Plts 25>>27 SvO2 69% MRSA swab +  Resolved Hospital Problem list   N/A  Assessment & Plan:  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  IABP in place. - Continue IABP wean per cardiology - Further management per primary  Hx DVT, on eliquis PTA- on hold given thrombocytopenia  Low cardiac filling pressures, acidosis, AKI - Bicarb drip - Monitor Uop  Metabolic encephalopathy- sepsis vs. HE vs. Uremia vs. combination - Check ammonia  Question sepsis, MRSA swab positive, congestion- reasonable to start abx, check Pct, culture data - Vanc/zosyn, UA, follow cultures  Hx EtOH, in mild w/d - No etoh for a week, on CIWA, thiamine folate etc  Thrombocytopenia, low albumin, transaminitis, coagulopathy- question undiagnosed EtOH cirrhosis - RUQ 01/07/20, trend  Severe protein calorie malnutrition present on admission - Protein supplements  Question of L weakness- head CT pending  Best practice:  Diet: per primary Pain/Anxiety/Delirium protocol (if indicated): CIWA VAP protocol (if indicated): n/a DVT prophylaxis: SCDs GI prophylaxis: PPI Glucose control: n/a Mobility: BR with  IABP in place Code Status: Full Family Communication:  Per primary Disposition:  ICU  Labs   CBC: Recent Labs  Lab Nov 25, 2019 1308 2019-11-25 1401 11/25/2019 2040 11/08/19 0500  WBC 16.8* 16.5* 18.7* 22.5*  NEUTROABS  --   --  15.8*  --   HGB 13.8 11.4* 12.3* 11.9*  HCT 44.1 34.9* 37.6* 37.4*  MCV 89.5 86.6 85.6 87.2  PLT 26* 25* 25* 27*     Basic Metabolic Panel: Recent Labs  Lab 2019/11/25 1308 25-Nov-2019 2040 11/08/19 0500  NA 133* 132* 135  K 4.2 3.7 3.3*  CL 90* 96* 101  CO2 18* 19* 17*  GLUCOSE 119* 113* 95  BUN 56* 65* 66*  CREATININE 3.79* 3.15* 2.81*  CALCIUM 7.6* 6.9* 6.3*  MG 1.8  --   --   PHOS 6.9*  --   --    GFR: Estimated Creatinine Clearance: 29.8 mL/min (A) (by C-G formula based on SCr of 2.81 mg/dL (H)). Recent Labs  Lab Nov 25, 2019 1308 2019/11/25 1401 11/25/19 1809 Nov 25, 2019 2040 11/25/2019 2247 11/08/19 0500  WBC 16.8* 16.5*  --  18.7*  --  22.5*  LATICACIDVEN  --   --  2.8*  --  2.0* 2.4*    Liver Function Tests: Recent Labs  Lab 2019-11-25 1308 11/25/2019 2040  AST 434* 432*  ALT 74* 69*  ALKPHOS 73 80  BILITOT 2.2* 2.0*  PROT 6.7 5.7*  ALBUMIN 2.2* 1.9*   No results for input(s): LIPASE, AMYLASE in the last 168 hours. No results for input(s): AMMONIA in the last 168 hours.  ABG    Component Value Date/Time   O2SAT 69.0 11/08/2019 0500     Coagulation Profile: Recent Labs  Lab 11/25/19 1308  INR 1.5*    Cardiac Enzymes: No results for input(s): CKTOTAL, CKMB, CKMBINDEX, TROPONINI in the last 168 hours.  HbA1C: Hgb A1c MFr Bld  Date/Time Value Ref Range Status  11/25/2019 02:01 PM 5.7 (H) 4.8 - 5.6 % Final    Comment:    (NOTE) Pre diabetes:          5.7%-6.4%  Diabetes:              >6.4%  Glycemic control for   <7.0% adults with diabetes     CBG: Recent Labs  Lab 11-25-2019 1922  GLUCAP 104*    Review of Systems:    Positive Symptoms in bold:  Constitutional fevers, chills, weight loss, fatigue, anorexia, malaise  Eyes decreased vision, double vision, eye irritation  Ears, Nose, Mouth, Throat sore throat, trouble swallowing, sinus congestion  Cardiovascular chest pain, paroxysmal nocturnal dyspnea, lower ext edema, palpitations   Respiratory SOB, cough, DOE, hemoptysis, wheezing  Gastrointestinal nausea, vomiting, diarrhea  Genitourinary burning  with urination, trouble urinating  Musculoskeletal joint aches, joint swelling, back pain  Integumentary  rashes, skin lesions  Neurological focal weakness, focal numbness, trouble speaking, headaches  Psychiatric depression, anxiety, confusion  Endocrine polyuria, polydipsia, cold intolerance, heat intolerance  Hematologic abnormal bruising, abnormal bleeding, unexplained nose bleeds  Allergic/Immunologic recurrent infections, hives, swollen lymph nodes     Past Medical History  He,  has a past medical history of Hypertension and Seizures (HCC).   Surgical History    Past Surgical History:  Procedure Laterality Date  . APPENDECTOMY    . REPAIR OF ESOPHAGUS  2014     Social History   reports that he has been smoking cigars. He has been smoking about 0.00 packs per day. He has never used  smokeless tobacco. He reports that he does not drink alcohol and does not use drugs.   Family History   His family history includes Diabetes in his mother; Prostate cancer in his father.   Allergies Allergies  Allergen Reactions  . Penicillins Hives and Rash    Rash  Has patient had a PCN reaction causing immediate rash, facial/tongue/throat swelling, SOB or lightheadedness with hypotension: YES Has patient had a PCN reaction causing severe rash involving mucus membranes or skin necrosis: NO Has patient had a PCN reaction that required hospitalizationNO Has patient had a PCN reaction occurring within the last 10 years: NO If all of the above answers are "NO", then may proceed with Cephalosporin use.     Home Medications  Prior to Admission medications   Medication Sig Start Date End Date Taking? Authorizing Provider  apixaban (ELIQUIS) 5 MG TABS tablet Take 2 tablets (10mg ) twice daily for 7 days, then 1 tablet (5mg ) twice daily Patient not taking: Reported on 11/16/2019 02/02/19   01/07/2020, MD  apixaban (ELIQUIS) 5 MG TABS tablet Take 1 tablet (5 mg total) by mouth 2 (two) times  daily. Patient not taking: Reported on 11/08/2019 02/24/19   01/07/2020, MD  diphenhydramine-acetaminophen (TYLENOL PM) 25-500 MG TABS tablet Take 1 tablet by mouth at bedtime as needed (sleep). Patient not taking: Reported on 11/06/2019    [provider]     Critical care time: 34 minutes

## 2019-11-08 NOTE — Progress Notes (Signed)
PHARMACY - PHYSICIAN COMMUNICATION CRITICAL VALUE ALERT - BLOOD CULTURE IDENTIFICATION (BCID)  Karl Alvarado is an 59 y.o. male who presented to [facility] on 11/21/2019 with a chief complaint of chest pain/STEMI.  Assessment:  BCx growing MRSA in 2 of 4 bottles.  Tmax 101.5, WBC 22.5, LA 2.4, PCT 33.9.  Name of physician (or Provider) Contacted: Dr. Katrinka Blazing  Current antibiotics: Vancomycin and Zosyn  Changes to prescribed antibiotics recommended:  Patient is on recommended antibiotics - No changes needed  Consider stopping Zosyn  Results for orders placed or performed during the hospital encounter of 11/09/2019  Blood Culture ID Panel (Reflexed) (Collected: 11/08/2019  1:02 AM)  Result Value Ref Range   Enterococcus faecalis NOT DETECTED NOT DETECTED   Enterococcus Faecium NOT DETECTED NOT DETECTED   Listeria monocytogenes NOT DETECTED NOT DETECTED   Staphylococcus species DETECTED (A) NOT DETECTED   Staphylococcus aureus (BCID) DETECTED (A) NOT DETECTED   Staphylococcus epidermidis NOT DETECTED NOT DETECTED   Staphylococcus lugdunensis NOT DETECTED NOT DETECTED   Streptococcus species NOT DETECTED NOT DETECTED   Streptococcus agalactiae NOT DETECTED NOT DETECTED   Streptococcus pneumoniae NOT DETECTED NOT DETECTED   Streptococcus pyogenes NOT DETECTED NOT DETECTED   A.calcoaceticus-baumannii NOT DETECTED NOT DETECTED   Bacteroides fragilis NOT DETECTED NOT DETECTED   Enterobacterales NOT DETECTED NOT DETECTED   Enterobacter cloacae complex NOT DETECTED NOT DETECTED   Escherichia coli NOT DETECTED NOT DETECTED   Klebsiella aerogenes NOT DETECTED NOT DETECTED   Klebsiella oxytoca NOT DETECTED NOT DETECTED   Klebsiella pneumoniae NOT DETECTED NOT DETECTED   Proteus species NOT DETECTED NOT DETECTED   Salmonella species NOT DETECTED NOT DETECTED   Serratia marcescens NOT DETECTED NOT DETECTED   Haemophilus influenzae NOT DETECTED NOT DETECTED   Neisseria meningitidis NOT DETECTED NOT  DETECTED   Pseudomonas aeruginosa NOT DETECTED NOT DETECTED   Stenotrophomonas maltophilia NOT DETECTED NOT DETECTED   Candida albicans NOT DETECTED NOT DETECTED   Candida auris NOT DETECTED NOT DETECTED   Candida glabrata NOT DETECTED NOT DETECTED   Candida krusei NOT DETECTED NOT DETECTED   Candida parapsilosis NOT DETECTED NOT DETECTED   Candida tropicalis NOT DETECTED NOT DETECTED   Cryptococcus neoformans/gattii NOT DETECTED NOT DETECTED   Meth resistant mecA/C and MREJ DETECTED (A) NOT DETECTED    Assia Meanor D. Laney Potash, PharmD, BCPS, BCCCP 11/08/2019, 3:21 PM

## 2019-11-08 NOTE — Progress Notes (Signed)
°  Echocardiogram 2D Echocardiogram has been performed with Definity.  Gerda Diss 11/08/2019, 2:38 PM

## 2019-11-08 NOTE — Procedures (Signed)
Intubation Procedure Note  SHAINE NEWMARK  [id number]  June 23, 1960  Date:11/08/19  Time:4:16 PM   Provider Performing:Syanne Looney C Katrinka Blazing    Procedure: Intubation (31500)  Indication(s) Respiratory Failure  Consent Unable to obtain consent due to emergent nature of procedure.   Anesthesia Etomidate, Versed, Fentanyl and Rocuronium   Time Out Verified patient identification, verified procedure, site/side was marked, verified correct patient position, special equipment/implants available, medications/allergies/relevant history reviewed, required imaging and test results available.   Sterile Technique Usual hand hygeine, masks, and gloves were used   Procedure Description Patient positioned in bed supine.  Sedation given as noted above.  Patient was intubated with endotracheal tube using Glidescope.  View was Grade 1 full glottis .  Number of attempts was 1.  Colorimetric CO2 detector was consistent with tracheal placement.   Complications/Tolerance None; patient tolerated the procedure well. Chest X-ray is ordered to verify placement.   EBL Minimal   Specimen(s) None

## 2019-11-09 ENCOUNTER — Inpatient Hospital Stay (HOSPITAL_COMMUNITY): Payer: Self-pay

## 2019-11-09 ENCOUNTER — Encounter (HOSPITAL_COMMUNITY): Payer: Self-pay | Admitting: Cardiovascular Disease

## 2019-11-09 DIAGNOSIS — N179 Acute kidney failure, unspecified: Secondary | ICD-10-CM

## 2019-11-09 DIAGNOSIS — R6521 Severe sepsis with septic shock: Secondary | ICD-10-CM

## 2019-11-09 DIAGNOSIS — J9600 Acute respiratory failure, unspecified whether with hypoxia or hypercapnia: Secondary | ICD-10-CM

## 2019-11-09 LAB — CBC
HCT: 34.9 % — ABNORMAL LOW (ref 39.0–52.0)
HCT: 33 % — ABNORMAL LOW (ref 39.0–52.0)
Hemoglobin: 10.7 g/dL — ABNORMAL LOW (ref 13.0–17.0)
Hemoglobin: 10.5 g/dL — ABNORMAL LOW (ref 13.0–17.0)
MCH: 28.4 pg (ref 26.0–34.0)
MCH: 28.1 pg (ref 26.0–34.0)
MCHC: 30.7 g/dL (ref 30.0–36.0)
MCHC: 31.8 g/dL (ref 30.0–36.0)
MCV: 92.6 fL (ref 80.0–100.0)
MCV: 88.2 fL (ref 80.0–100.0)
Platelets: 14 10*3/uL — CL (ref 150–400)
Platelets: 24 10*3/uL — CL (ref 150–400)
RBC: 3.77 MIL/uL — ABNORMAL LOW (ref 4.22–5.81)
RBC: 3.74 MIL/uL — ABNORMAL LOW (ref 4.22–5.81)
RDW: 20.8 % — ABNORMAL HIGH (ref 11.5–15.5)
RDW: 20.3 % — ABNORMAL HIGH (ref 11.5–15.5)
WBC: 36.8 10*3/uL — ABNORMAL HIGH (ref 4.0–10.5)
WBC: 34.6 10*3/uL — ABNORMAL HIGH (ref 4.0–10.5)
nRBC: 0.1 % (ref 0.0–0.2)
nRBC: 0.5 % — ABNORMAL HIGH (ref 0.0–0.2)

## 2019-11-09 LAB — URINALYSIS, ROUTINE W REFLEX MICROSCOPIC
Bacteria, UA: NONE SEEN
Bilirubin Urine: NEGATIVE
Bilirubin Urine: NEGATIVE
Glucose, UA: NEGATIVE mg/dL
Glucose, UA: NEGATIVE mg/dL
Ketones, ur: NEGATIVE mg/dL
Ketones, ur: NEGATIVE mg/dL
Leukocytes,Ua: NEGATIVE
Leukocytes,Ua: NEGATIVE
Nitrite: NEGATIVE
Nitrite: NEGATIVE
Protein, ur: 100 mg/dL — AB
Protein, ur: 100 mg/dL — AB
Specific Gravity, Urine: 1.02 (ref 1.005–1.030)
Specific Gravity, Urine: 1.021 (ref 1.005–1.030)
pH: 5 (ref 5.0–8.0)
pH: 5 (ref 5.0–8.0)

## 2019-11-09 LAB — POCT I-STAT 7, (LYTES, BLD GAS, ICA,H+H)
Acid-Base Excess: 0 mmol/L (ref 0.0–2.0)
Bicarbonate: 22.2 mmol/L (ref 20.0–28.0)
Calcium, Ion: 0.89 mmol/L — CL (ref 1.15–1.40)
HCT: 40 % (ref 39.0–52.0)
Hemoglobin: 13.6 g/dL (ref 13.0–17.0)
O2 Saturation: 99 %
Potassium: 3.6 mmol/L (ref 3.5–5.1)
Sodium: 134 mmol/L — ABNORMAL LOW (ref 135–145)
TCO2: 23 mmol/L (ref 22–32)
pCO2 arterial: 29.8 mmHg — ABNORMAL LOW (ref 32.0–48.0)
pH, Arterial: 7.48 — ABNORMAL HIGH (ref 7.350–7.450)
pO2, Arterial: 122 mmHg — ABNORMAL HIGH (ref 83.0–108.0)

## 2019-11-09 LAB — COMPREHENSIVE METABOLIC PANEL
ALT: 136 U/L — ABNORMAL HIGH (ref 0–44)
AST: 699 U/L — ABNORMAL HIGH (ref 15–41)
Albumin: 1.3 g/dL — ABNORMAL LOW (ref 3.5–5.0)
Alkaline Phosphatase: 81 U/L (ref 38–126)
Anion gap: 26 — ABNORMAL HIGH (ref 5–15)
BUN: 67 mg/dL — ABNORMAL HIGH (ref 6–20)
CO2: 13 mmol/L — ABNORMAL LOW (ref 22–32)
Calcium: 6.1 mg/dL — CL (ref 8.9–10.3)
Chloride: 100 mmol/L (ref 98–111)
Creatinine, Ser: 2.64 mg/dL — ABNORMAL HIGH (ref 0.61–1.24)
GFR calc Af Amer: 30 mL/min — ABNORMAL LOW (ref >60)
GFR calc non Af Amer: 26 mL/min — ABNORMAL LOW (ref >60)
Glucose, Bld: 164 mg/dL — ABNORMAL HIGH (ref 70–99)
Potassium: 4.3 mmol/L (ref 3.5–5.1)
Sodium: 139 mmol/L (ref 135–145)
Total Bilirubin: 3.1 mg/dL — ABNORMAL HIGH (ref 0.3–1.2)
Total Protein: 3.8 g/dL — ABNORMAL LOW (ref 6.5–8.1)

## 2019-11-09 LAB — CBC WITH DIFFERENTIAL/PLATELET
Abs Immature Granulocytes: 0 10*3/uL (ref 0.00–0.07)
Basophils Absolute: 0 10*3/uL (ref 0.0–0.1)
Basophils Relative: 0 %
Eosinophils Absolute: 0 10*3/uL (ref 0.0–0.5)
Eosinophils Relative: 0 %
HCT: 34.8 % — ABNORMAL LOW (ref 39.0–52.0)
Hemoglobin: 10.7 g/dL — ABNORMAL LOW (ref 13.0–17.0)
Lymphocytes Relative: 4 %
Lymphs Abs: 1.3 10*3/uL (ref 0.7–4.0)
MCH: 28.2 pg (ref 26.0–34.0)
MCHC: 30.7 g/dL (ref 30.0–36.0)
MCV: 91.8 fL (ref 80.0–100.0)
Monocytes Absolute: 2.7 10*3/uL — ABNORMAL HIGH (ref 0.1–1.0)
Monocytes Relative: 8 %
Neutro Abs: 29.3 10*3/uL — ABNORMAL HIGH (ref 1.7–7.7)
Neutrophils Relative %: 88 %
Platelets: 12 10*3/uL — CL (ref 150–400)
RBC: 3.79 MIL/uL — ABNORMAL LOW (ref 4.22–5.81)
RDW: 20.8 % — ABNORMAL HIGH (ref 11.5–15.5)
WBC: 33.3 10*3/uL — ABNORMAL HIGH (ref 4.0–10.5)
nRBC: 0 /100 WBC
nRBC: 0.2 % (ref 0.0–0.2)

## 2019-11-09 LAB — POCT I-STAT, CHEM 8
BUN: 48 mg/dL — ABNORMAL HIGH (ref 6–20)
BUN: 60 mg/dL — ABNORMAL HIGH (ref 6–20)
Calcium, Ion: 0.83 mmol/L — CL (ref 1.15–1.40)
Calcium, Ion: 0.78 mmol/L — CL (ref 1.15–1.40)
Chloride: 88 mmol/L — ABNORMAL LOW (ref 98–111)
Chloride: 97 mmol/L — ABNORMAL LOW (ref 98–111)
Creatinine, Ser: 2.7 mg/dL — ABNORMAL HIGH (ref 0.61–1.24)
Creatinine, Ser: 2.8 mg/dL — ABNORMAL HIGH (ref 0.61–1.24)
Glucose, Bld: 107 mg/dL — ABNORMAL HIGH (ref 70–99)
Glucose, Bld: 192 mg/dL — ABNORMAL HIGH (ref 70–99)
HCT: 38 % — ABNORMAL LOW (ref 39.0–52.0)
HCT: 34 % — ABNORMAL LOW (ref 39.0–52.0)
Hemoglobin: 12.9 g/dL — ABNORMAL LOW (ref 13.0–17.0)
Hemoglobin: 11.6 g/dL — ABNORMAL LOW (ref 13.0–17.0)
Potassium: 3.6 mmol/L (ref 3.5–5.1)
Potassium: 4.1 mmol/L (ref 3.5–5.1)
Sodium: 124 mmol/L — ABNORMAL LOW (ref 135–145)
Sodium: 138 mmol/L (ref 135–145)
TCO2: 17 mmol/L — ABNORMAL LOW (ref 22–32)
TCO2: 19 mmol/L — ABNORMAL LOW (ref 22–32)

## 2019-11-09 LAB — HEPATIC FUNCTION PANEL
ALT: 74 U/L — ABNORMAL HIGH (ref 0–44)
AST: 413 U/L — ABNORMAL HIGH (ref 15–41)
Albumin: 1.7 g/dL — ABNORMAL LOW (ref 3.5–5.0)
Alkaline Phosphatase: 79 U/L (ref 38–126)
Bilirubin, Direct: 1.9 mg/dL — ABNORMAL HIGH (ref 0.0–0.2)
Indirect Bilirubin: 1 mg/dL — ABNORMAL HIGH (ref 0.3–0.9)
Total Bilirubin: 2.9 mg/dL — ABNORMAL HIGH (ref 0.3–1.2)
Total Protein: 4.4 g/dL — ABNORMAL LOW (ref 6.5–8.1)

## 2019-11-09 LAB — GLUCOSE, CAPILLARY
Glucose-Capillary: 108 mg/dL — ABNORMAL HIGH (ref 70–99)
Glucose-Capillary: 128 mg/dL — ABNORMAL HIGH (ref 70–99)
Glucose-Capillary: 148 mg/dL — ABNORMAL HIGH (ref 70–99)
Glucose-Capillary: 164 mg/dL — ABNORMAL HIGH (ref 70–99)
Glucose-Capillary: 179 mg/dL — ABNORMAL HIGH (ref 70–99)
Glucose-Capillary: 99 mg/dL (ref 70–99)

## 2019-11-09 LAB — DIC (DISSEMINATED INTRAVASCULAR COAGULATION)PANEL
D-Dimer, Quant: >20 ug/mL-FEU — ABNORMAL HIGH (ref 0.00–0.50)
Fibrinogen: 402 mg/dL (ref 210–475)
INR: 3.2 — ABNORMAL HIGH (ref 0.8–1.2)
Platelets: 12 10*3/uL — CL (ref 150–400)
Prothrombin Time: 31.6 seconds — ABNORMAL HIGH (ref 11.4–15.2)
Smear Review: NONE SEEN
aPTT: 54 seconds — ABNORMAL HIGH (ref 24–36)

## 2019-11-09 LAB — LACTIC ACID, PLASMA
Lactic Acid, Venous: >11 mmol/L (ref 0.5–1.9)
Lactic Acid, Venous: >11 mmol/L (ref 0.5–1.9)
Lactic Acid, Venous: >11 mmol/L (ref 0.5–1.9)

## 2019-11-09 LAB — PHOSPHORUS: Phosphorus: 6.5 mg/dL — ABNORMAL HIGH (ref 2.5–4.6)

## 2019-11-09 LAB — COOXEMETRY PANEL
Carboxyhemoglobin: 1 % (ref 0.5–1.5)
Methemoglobin: 1.1 % (ref 0.0–1.5)
O2 Saturation: 66.9 %
Total hemoglobin: 11.6 g/dL — ABNORMAL LOW (ref 12.0–16.0)

## 2019-11-09 LAB — PROCALCITONIN: Procalcitonin: 50.38 ng/mL

## 2019-11-09 LAB — BASIC METABOLIC PANEL
Anion gap: 29 — ABNORMAL HIGH (ref 5–15)
BUN: 66 mg/dL — ABNORMAL HIGH (ref 6–20)
CO2: 11 mmol/L — ABNORMAL LOW (ref 22–32)
Calcium: 6.6 mg/dL — ABNORMAL LOW (ref 8.9–10.3)
Chloride: 101 mmol/L (ref 98–111)
Creatinine, Ser: 2.77 mg/dL — ABNORMAL HIGH (ref 0.61–1.24)
GFR calc Af Amer: 28 mL/min — ABNORMAL LOW (ref >60)
GFR calc non Af Amer: 24 mL/min — ABNORMAL LOW (ref >60)
Glucose, Bld: 104 mg/dL — ABNORMAL HIGH (ref 70–99)
Potassium: 4.2 mmol/L (ref 3.5–5.1)
Sodium: 141 mmol/L (ref 135–145)

## 2019-11-09 LAB — PROTIME-INR
INR: 3.9 — ABNORMAL HIGH (ref 0.8–1.2)
Prothrombin Time: 36.7 seconds — ABNORMAL HIGH (ref 11.4–15.2)

## 2019-11-09 LAB — MAGNESIUM: Magnesium: 2.4 mg/dL (ref 1.7–2.4)

## 2019-11-09 LAB — HCV INTERPRETATION

## 2019-11-09 LAB — HCV AB W REFLEX TO QUANT PCR: HCV Ab: <0.1 s/co ratio (ref 0.0–0.9)

## 2019-11-09 MED ORDER — LACTATED RINGERS IV BOLUS
1000.0000 mL | Freq: Once | INTRAVENOUS | Status: AC
  Administered 2019-11-09: 1000 mL via INTRAVENOUS

## 2019-11-09 MED ORDER — CALCIUM GLUCONATE-NACL 1-0.675 GM/50ML-% IV SOLN
1.0000 g | Freq: Once | INTRAVENOUS | Status: AC
  Administered 2019-11-09: 1000 mg via INTRAVENOUS
  Filled 2019-11-09: qty 50

## 2019-11-09 MED ORDER — MIDAZOLAM HCL 2 MG/2ML IJ SOLN: 2.0000 mg | INTRAMUSCULAR | Status: DC | PRN

## 2019-11-09 MED ORDER — MIDAZOLAM HCL 2 MG/2ML IJ SOLN
2.0000 mg | INTRAMUSCULAR | Status: DC | PRN
  Administered 2019-11-09: 2 mg via INTRAVENOUS
  Filled 2019-11-09: qty 2

## 2019-11-09 MED ORDER — LACTATED RINGERS IV BOLUS
500.0000 mL | Freq: Once | INTRAVENOUS | Status: AC
  Administered 2019-11-09: 500 mL via INTRAVENOUS

## 2019-11-09 MED ORDER — CALCIUM GLUCONATE-NACL 2-0.675 GM/100ML-% IV SOLN
2.0000 g | Freq: Once | INTRAVENOUS | Status: AC
  Administered 2019-11-09: 2000 mg via INTRAVENOUS
  Filled 2019-11-09: qty 100

## 2019-11-09 MED ORDER — AMIODARONE HCL IN DEXTROSE 360-4.14 MG/200ML-% IV SOLN
60.0000 mg/h | INTRAVENOUS | Status: AC
  Administered 2019-11-09 (×2): 60 mg/h via INTRAVENOUS
  Filled 2019-11-09 (×2): qty 200

## 2019-11-09 MED ORDER — SODIUM CHLORIDE 0.9 % IV SOLN
600.0000 mg | Freq: Every day | INTRAVENOUS | Status: DC
  Administered 2019-11-09: 600 mg via INTRAVENOUS
  Filled 2019-11-09 (×2): qty 12

## 2019-11-09 MED ORDER — EPINEPHRINE HCL 5 MG/250ML IV SOLN IN NS
0.5000 ug/min | INTRAVENOUS | Status: DC
  Administered 2019-11-09: 12:00:00 0.5 ug/min via INTRAVENOUS
  Administered 2019-11-10 (×2): 4 ug/min via INTRAVENOUS
  Filled 2019-11-09 (×3): qty 250

## 2019-11-09 MED ORDER — AMIODARONE LOAD VIA INFUSION
150.0000 mg | Freq: Once | INTRAVENOUS | Status: AC
  Administered 2019-11-09: 150 mg via INTRAVENOUS
  Filled 2019-11-09: qty 83.34

## 2019-11-09 MED ORDER — PHENYLEPHRINE HCL-NACL 10-0.9 MG/250ML-% IV SOLN: 0.0000 ug/min | INTRAVENOUS | Status: DC

## 2019-11-09 MED ORDER — SODIUM CHLORIDE 0.9% IV SOLUTION: Freq: Once | INTRAVENOUS | Status: DC

## 2019-11-09 MED ORDER — AMIODARONE HCL IN DEXTROSE 360-4.14 MG/200ML-% IV SOLN
30.0000 mg/h | INTRAVENOUS | Status: DC
  Administered 2019-11-09 – 2019-11-10 (×2): 30 mg/h via INTRAVENOUS
  Filled 2019-11-09 (×2): qty 200

## 2019-11-09 NOTE — Progress Notes (Signed)
[facility]    Date of Admission:  11/28/2019   Total days of antibiotics 2           ID: Karl Alvarado is a 59 y.o. male with   Principal Problem:   MRSA bacteremia Active Problems:   ETOH abuse   ST elevation myocardial infarction involving left anterior descending (LAD) coronary artery (HCC)   Thrombocytopenia (HCC)   AKI (acute kidney injury) ([place])   Cardiogenic shock (HCC)   History of DVT (deep vein thrombosis)   Cigarette smoker   Normocytic anemia   S/P thoracotomy   Cirrhosis ([place])   Septic shock (HCC)   Acute respiratory failure (HCC)    Subjective: Remains intubated, with IABP, remains febrile  Medications:  . sodium chloride   Intravenous Once  . chlorhexidine gluconate (MEDLINE KIT)  15 mL Mouth Rinse BID  . Chlorhexidine Gluconate Cloth  6 each Topical Daily  . docusate  100 mg Oral BID  . folic acid  1 mg Intravenous Daily  . hydrocortisone sod succinate (SOLU-CORTEF) inj  50 mg Intravenous Q6H  . insulin aspart  1-3 Units Subcutaneous Q4H  . mouth rinse  15 mL Mouth Rinse 10 times per day  . multivitamin  15 mL Oral Daily  . mupirocin ointment  1 application Nasal BID  . pantoprazole (PROTONIX) IV  40 mg Intravenous Q24H  . polyethylene glycol  17 g Oral Daily  . sodium chloride flush  10-40 mL Intracatheter Q12H  . sodium chloride flush  3 mL Intravenous Q12H  . thiamine  100 mg Oral Daily   Or  . thiamine  100 mg Intravenous Daily    Objective: Vital signs in last 24 hours: Temp:  [99.1 F (37.3 C)-102.2 F (39 C)] 102 F (38.9 C) (08/09 1745) Pulse Rate:  [52-147] 96 (08/09 1745) Resp:  [13-35] 24 (08/09 1745) BP: (53-113)/(29-96) 88/45 (08/09 1800) SpO2:  [90 %-100 %] 92 % (08/09 1745) Arterial Line BP: (71-112)/(46-82) 94/64 (08/09 1600) FiO2 (%):  [40 %-60 %] 40 % (08/09 1539) Weight:  [72.8 kg] 72.8 kg (08/09 0304) Physical Exam  Constitutional: He is sedated. He appears well-developed and  well-nourished. No distress.  HENT:  Mouth/Throat: OETT in palce Cardiovascular: tachycardic, regular rhythm and normal heart sounds. Exam reveals no gallop and no friction rub.  No murmur heard.  Pulmonary/Chest: Effort normal and breath sounds normal. No respiratory distress. He has no wheezes.  Abdominal: Soft. Bowel sounds are normal. He exhibits no distension. There is no tenderness.  Lymphadenopathy:  He has no cervical adenopathy.  Neurological: He is alert and oriented to person, place, and time.  Skin: Skin is warm and dry. No rash noted. No erythema. Mild mottling of hands. Cool to touch on arms and legs Psychiatric: He has a normal mood and affect. His behavior is normal.     Lab Results Recent Labs    11/09/19 0251 11/09/19 0251 11/09/19 0522 11/09/19 1653  WBC 36.8*   < > 33.3* 34.6*  HGB 10.7*   < > 10.7* 10.5*  HCT 34.9*   < > 34.8* 33.0*  NA 141  --   --  139  K 4.2  --   --  4.3  CL 101  --   --  100  CO2 11*  --   --  13*  BUN 66*  --   --  67*  CREATININE 2.77*  --   --  2.64*   < > =  values in this interval not displayed.   Liver Panel Recent Labs    11/19/2019 1308 11/14/2019 2040 11/09/19 0251 11/09/19 1653  PROT 6.7   < > 4.4* 3.8*  ALBUMIN 2.2*   < > 1.7* 1.3*  AST 434*   < > 413* 699*  ALT 74*   < > 74* 136*  ALKPHOS 73   < > 79 81  BILITOT 2.2*   < > 2.9* 3.1*  BILIDIR 1.0*  --  1.9*  --   IBILI 1.2*  --  1.0*  --    < > = values in this interval not displayed.    Microbiology: 8/9 blood cx pending 8/8 blood cx MRSA Studies/Results: DG Abd 1 View  Result Date: 11/08/2019 CLINICAL DATA:  Intubation and central line placement.  OG tube. EXAM: ABDOMEN - 1 VIEW COMPARISON:  None. FINDINGS: Orogastric tube tip overlies the level of the proximal stomach, side port in the region of the gastroesophageal junction. Consider advancing to another 10 centimeters into the body of the stomach. Bowel gas pattern is nonobstructive. No evidence for free  intraperitoneal air. IMPRESSION: Orogastric tube tip to the level of the proximal stomach. Consider advancing to another 10 centimeters into the body of the stomach. Electronically Signed   By: Nolon Nations M.D.   On: 11/08/2019 16:41   CT HEAD WO CONTRAST  Result Date: 11/08/2019 CLINICAL DATA:  Neuro deficit, acute stroke suspected, left leg weakness this morning EXAM: CT HEAD WITHOUT CONTRAST TECHNIQUE: Contiguous axial images were obtained from the base of the skull through the vertex without intravenous contrast. COMPARISON:  None. FINDINGS: Brain: There is minimal subarachnoid blood in a single precentral sulcus of the posterior left frontal lobe (series 3, image 23). No evidence of acute infarction, hydrocephalus, extra-axial collection or mass lesion/mass effect. Mild periventricular white matter hypodensity. Vascular: No hyperdense vessel or unexpected calcification. Skull: Normal. Negative for fracture or focal lesion. Sinuses/Orbits: No acute finding. Other: None. IMPRESSION: 1. There is minimal subarachnoid blood in a single precentral sulcus of the posterior left frontal lobe. No other acute intracranial pathology. 2. There is no cortical hypodensity of the right hemisphere or other left-sided motor correlate to explain left leg weakness. Consider MRI to further evaluate for acute diffusion restricting infarction. 3. Small-vessel white matter disease. These results will be called to the ordering clinician or representative by the Radiologist Assistant, and communication documented in the PACS or [employer]. Electronically Signed   By: Eddie Candle M.D.   On: 11/08/2019 12:54   CARDIAC CATHETERIZATION  Result Date: 11/01/2019 Cardiogenic shock in this patient who presented with ostial LAD thrombus in the setting of profound thrombocytopenia with platelet counts at 25,000 and with associated anterolateral apical to the distal inferoapical severe hypocontractility with EF estimate at 35% by  catheterization earlier today. VIP Swan-Ganz catheterization demonstrating low right heart pressures. Successful insertion of intra-aortic balloon pump.  Following initiation of Levophed at 5 mcg/min and with 1:2 pulsation, blood pressure is now augmenting to 244 systolically. Plan gentle hydration with low filling pressures. Follow-up laboratory has been sent in the Cath Lab to reassess renal function with AKI, severe thrombocytopenia, BNP as well as lactic acid.  DG CHEST PORT 1 VIEW  Result Date: 11/08/2019 CLINICAL DATA:  Intubation and central line placement.  OG tube. EXAM: PORTABLE CHEST 1 VIEW COMPARISON:  11/08/2019 FINDINGS: Endotracheal tube is in place, tip 4.5 centimeters above the carina. Nasogastric tube has been placed, tip overlying the level of  the proximal stomach, side port in the region of the gastroesophageal junction. Consider advancing orogastric tube 10 centimeters. Inferior approach Swan-Ganz catheter tip overlies the level of the RIGHT pulmonary artery. Heart size is normal. Lungs are clear. The no pneumothorax. Remote RIGHT rib fractures. IMPRESSION: 1. Lines and tubes as described. 2. Consider advancing orogastric tube 10 centimeters. 3. No evidence for acute cardiopulmonary abnormality. Electronically Signed   By: Nolon Nations M.D.   On: 11/08/2019 16:39   DG CHEST PORT 1 VIEW  Result Date: 11/08/2019 CLINICAL DATA:  Myocardial infarction EXAM: PORTABLE CHEST 1 VIEW COMPARISON:  11/23/2019 FINDINGS: Normal cardiac silhouette. Swan-Ganz catheter from a femoral approach with tip in the RIGHT main pulmonary artery. Intra-aortic balloon pump tip noted at the level of the transverse arch. No pulmonary edema.  No pneumothorax.  Remote LEFT rib fractures IMPRESSION: 1. No pulmonary edema or pneumothorax. 2. Support apparatus as above. Electronically Signed   By: Suzy Bouchard M.D.   On: 11/08/2019 14:01   ECHOCARDIOGRAM COMPLETE  Result Date: 11/08/2019    ECHOCARDIOGRAM REPORT    Patient Name:   Karl Alvarado Date of Exam: 11/08/2019 Medical Rec #:  [id number]     Height:       74.0 in Accession #:    [id number]    Weight:       160.0 lb Date of Birth:  1961/01/13     BSA:          1.977 m Patient Age:    58 years      BP:           94/77 mmHg Patient Gender: M             HR:           130 bpm. Exam Location:  Inpatient Procedure: 2D Echo, Cardiac Doppler, Color Doppler and Intracardiac            Opacification Agent Indications:    STEMI  History:        Patient has no prior history of Echocardiogram examinations.                 Acute MI; Risk Factors:Current Smoker. ETOH abuse.  Sonographer:    Clayton Lefort RDCS (AE) Referring Phys: [place] Comments: Technically challenging study due to limited acoustic windows, Technically difficult study due to poor echo windows and suboptimal parasternal window. IMPRESSIONS  1. Mobile mass on aortic valve. Not well visualized due to poor image quality, however best seen in clip 10. The aortic valve was not well visualized. Aortic valve regurgitation is trivial. No aortic stenosis is present.  2. Left ventricular ejection fraction, by estimation, is 45 to 50%. The left ventricle has mildly decreased function. The left ventricle demonstrates regional wall motion abnormalities (see scoring diagram/findings for description). Left ventricular diastolic function could not be evaluated.  3. Right ventricular systolic function is normal. The right ventricular size is normal. Tricuspid regurgitation signal is inadequate for assessing PA pressure.  4. The mitral valve is grossly normal. Trivial mitral valve regurgitation. FINDINGS  Left Ventricle: Left ventricular ejection fraction, by estimation, is 45 to 50%. The left ventricle has mildly decreased function. The left ventricle demonstrates regional wall motion abnormalities. Definity contrast agent was given IV to delineate the left ventricular endocardial borders. Left ventricular  diastolic function could not be evaluated.  LV Wall Scoring: The mid and distal anterior wall, mid and distal lateral wall, mid anterolateral segment,  apical septal segment, and apical inferior segment are hypokinetic. Right Ventricle: The right ventricular size is normal. Right vetricular wall thickness was not assessed. Right ventricular systolic function is normal. Tricuspid regurgitation signal is inadequate for assessing PA pressure. Left Atrium: Left atrial size was not well visualized. Right Atrium: Right atrial size was not well visualized. Pericardium: A small pericardial effusion is present. Mitral Valve: The mitral valve is grossly normal. Trivial mitral valve regurgitation. Tricuspid Valve: The tricuspid valve is not well visualized. Tricuspid valve regurgitation is trivial. Aortic Valve: Mobile mass on aortic valve. Not well visualized due to poor image quality, however best seen in clip 10. The aortic valve was not well visualized. Aortic valve regurgitation is trivial. No aortic stenosis is present. Aortic valve mean gradient measures 3.3 mmHg. Aortic valve peak gradient measures 5.9 mmHg. Aortic valve area, by VTI measures 2.61 cm. Pulmonic Valve: The pulmonic valve was not well visualized. Pulmonic valve regurgitation is not visualized. Aorta: The aortic root was not well visualized. IAS/Shunts: No atrial level shunt detected by color flow Doppler. Additional Comments: A venous catheter is visualized in the inferior vena cava.  LEFT VENTRICLE PLAX 2D LVOT diam:     2.10 cm LV SV:         40 LV SV Index:   20 LVOT Area:     3.46 cm  RIGHT VENTRICLE             IVC RV S prime:     13.50 cm/s  IVC diam: 1.50 cm TAPSE (M-mode): 1.8 cm LEFT ATRIUM             Index       RIGHT ATRIUM           Index LA Vol (A2C):   32.7 ml 16.54 ml/m RA Area:     13.00 cm LA Vol (A4C):   56.5 ml 28.58 ml/m RA Volume:   34.80 ml  17.60 ml/m LA Biplane Vol: 44.7 ml 22.61 ml/m  AORTIC VALVE AV Area (Vmax):    2.85  cm AV Area (Vmean):   2.65 cm AV Area (VTI):     2.61 cm AV Vmax:           121.33 cm/s AV Vmean:          88.800 cm/s AV VTI:            0.154 m AV Peak Grad:      5.9 mmHg AV Mean Grad:      3.3 mmHg LVOT Vmax:         99.67 cm/s LVOT Vmean:        68.000 cm/s LVOT VTI:          0.116 m LVOT/AV VTI ratio: 0.75  SHUNTS Systemic VTI:  0.12 m Systemic Diam: 2.10 cm Cherlynn Kaiser MD Electronically signed by Cherlynn Kaiser MD Signature Date/Time: 11/08/2019/5:46:43 PM    Final    US Abdomen Limited RUQ  Result Date: 11/08/2019 CLINICAL DATA:  Cirrhosis.  Inpatient. EXAM: ULTRASOUND ABDOMEN LIMITED RIGHT UPPER QUADRANT COMPARISON:  None. FINDINGS: Gallbladder: No gallstones or wall thickening visualized. No sonographic Murphy sign noted by sonographer. Common bile duct: Diameter: 5 mm Liver: Liver parenchyma is diffusely moderately echogenic. No liver masses, noting decreased sensitivity in the setting of an echogenic liver. Liver surface appears finely irregular. Portal vein is patent on color Doppler imaging with normal direction of blood flow towards the liver. Other: None. IMPRESSION: 1. Finely irregular liver surface, compatible  with reported history of cirrhosis. Diffusely moderately echogenic liver parenchyma is a nonspecific finding that could represent steatosis and/or fibrosis. No liver masses. 2. Normal gallbladder and bile ducts. Electronically Signed   By: Ilona Sorrel M.D.   On: 11/08/2019 11:57     Assessment/Plan: 59yo M with liver disease admitted for being found down at home, septic/cardiac shock with inferior stemi with IABP to help cardiac output, still having high pressor needs, with LA > 11. TTE suggested AV veg with thought the embolized to LAD. - will switch him to daptomycin to minimize kidney injury though I suspect that septic/cardiac shock is resulting in poor perfusion overall given physical findings -discussed with his mother that this is very high risk for mortality and  difficult to overcome.   [facility] Cell: [phone] Pager: [phone]  11/09/2019, 6:24 PM

## 2019-11-09 NOTE — Progress Notes (Addendum)
NAME:  Karl Alvarado, MRN:  [id number], DOB:  1961-02-09, LOS: 2 ADMISSION DATE:  17-Nov-2019, CONSULTATION DATE:  11/08/19 REFERRING MD:  Tresa Endo, CHIEF COMPLAINT:  Feels ill   Brief History   59 year old man with heavy etoh abuse history, htn, p/w inferolateral stemi from LAD thrombus unable to intervene due to AKI and severe thrombocytopenia.  IABP placed and patient brought to ICU.   Care complicated by development of sepsis.  History of present illness   59 year old man with heavy etoh abuse history, htn, p/w inferolateral stemi from LAD thrombus unable to intervene due to AKI and severe thrombocytopenia.  IABP placed last night due to low Bps after plt transfusion.   Today's care complicated by development of sepsis (fevers, rising white count) and new left sided weakness.  Currently c/o cough/congestion, chills.  Denies dysuria, N/V/D.  IABP at 1:2.  Plts remain low.  Confused.  Past Medical History  EtOH abuse Prior DVT not currently on [facility] Prior seizures presumed due to EtOH w/d Tobacco abuse Alcoholic malnutrition Question EtOH cirrhosis  Significant Hospital Events   8/7 admitted, LHC, IABP 8/8 fevers, MRSA bacteremia, worsening shock. PCCM and ID consulted  8/9 worsening thrombocytopenia, worsening shock   Consults:  PCCM ID  Procedures:  8/7 LHC, IABP  Significant Diagnostic Tests:  CXR hyperinflation CT H> no acute intracranial abnormality   Micro Data:  COVID neg Blood cx x 2 > MRSA  UA>>  Antimicrobials:  Vanc 8/8>> Zosyn 8/8>>   Interim history/subjective:  Overnight LA elevation from 2.4 to >11, has remained >11  Worsening shock -- NE at 60 Vaso at 0.03   Weaning off precedex this morning in favor of fentanyl   Objective   Blood pressure (!) 89/64, pulse (!) 133, temperature (!) 101.1 F (38.4 C), resp. rate (!) 24, height 6\' 2"  (1.88 m), weight 72.8 kg, SpO2 90 %. PAP: (25-41)/(9-22) 29/14 CVP:  [0 mmHg-14 mmHg] 7 mmHg  Vent Mode: PRVC FiO2 (%):   [40 %-100 %] 60 % Set Rate:  [16 bmp-22 bmp] 22 bmp Vt Set:  [650 mL] 650 mL PEEP:  [5 cmH20] 5 cmH20 Plateau Pressure:  [20 cmH20-22 cmH20] 22 cmH20   Intake/Output Summary (Last 24 hours) at 11/09/2019 0729 Last data filed at 11/09/2019 0630 Gross per 24 hour  Intake 5413.18 ml  Output 1100 ml  Net 4313.18 ml   Filed Weights   November 17, 2019 1319 11/09/19 0304  Weight: 72.6 kg 72.8 kg    Examination: General: Critically and chronically ill appearing M, appears older than stated age. Intubated, sedated, on IABP.  HENT: NCAT, temporal muscle wasting. ETT OGT secure. OGT with scant brown output  Lungs: Symmetrical chest expansion, mechanically ventilated. Diminished bibasilar sounds  Cardiovascular: irir distant heart sounds. Cap refill sluggish.  Abdomen: Soft, slightly distended. Hypoactive bowel sounds Extremities: muscle wasting Neuro: sedated, does not follow commands. PERRL 33mm. Initiating breaths on MV GU: Foley. Amber urine with sediment  Skin: cool to touch. Scattered ecchymosis and petechiae. Mottling of distal extremities  Resolved Hospital Problem list   N/A  Assessment & Plan:  Metabolic encephalopathy- sepsis vs. HE vs. Uremia vs. combination - Check ammonia  Shock -likely combination septic shock and cardiogenic shock MRSA Bacteremia P - ID following - Vanc/zosyn, UA, follow cultures - checking co-ox, wedge this morning  -Titrate pressors for goal-- currently on NE 60 and vaso 0.03.  -adding neo  -TEE when able   Inferiolateral STEMI due to LAD  ostial thrombus - without intervention due to low plts and AKI.   -IABP in place. P - Continue IABP wean per cardiology - Further management per primary   Lactic acidosis ->11. Significant change from 2.4 day prior - worry for abdominal process  P -would like to obtain CT a/p -- too unstable at present  -trend -supportive care   Thrombocytopenia -worsening, 14 on 11/09/19 AM low albumin,  transaminitis,    Coagulopathy - RUS [place] compatible with cirrhosis  P -HIV, Hep C  -will also check DIC panel -trend plt, transfuse if < 10 or if bleeding, if needs subsequent invasive procedure   AKI P -on bicarb gtt -foley cath  -trend renal indices uop -UA   Hypocalcemia P -additional 1g cal glu now   Hx EtOH, in mild w/d - No etoh for a week, on CIWA, thiamine folate etc  Atrial fib Hx DVT, on eliquis PTA- on hold given thrombocytopenia P -mag  > 2, K > 4 -calcium as above  -amio bolus then gtt  Severe protein calorie malnutrition present on admission - holding EN at this time   Question of L weakness CT H without evidence of acute cva  Best practice:  Diet: per primary Pain/Anxiety/Delirium protocol (if indicated): fent gtt  VAP protocol (if indicated): n/a DVT prophylaxis: SCDs GI prophylaxis: PPI Glucose control: n/a Mobility: BR with IABP in place Code Status: Full -- goals of care/code status needs to be addressed. Family Communication: Per primary Disposition:  ICU  Labs   CBC: Recent Labs  Lab Nov 19, 2019 1308 11-19-2019 1308 11/19/2019 1401 11/19/19 1401 November 19, 2019 2040 11-19-2019 2040 11/08/19 0500 11/08/19 1508 11/08/19 1705 11/08/19 2232 11/09/19 0251  WBC 16.8*  --  16.5*  --  18.7*  --  22.5*  --   --   --  36.8*  NEUTROABS  --   --   --   --  15.8*  --   --   --   --   --   --   HGB 13.8   < > 11.4*   < > 12.3*   < > 11.9* 12.9* 11.6* 12.2* 10.7*  HCT 44.1   < > 34.9*   < > 37.6*   < > 37.4* 38.0* 34.0* 36.0* 34.9*  MCV 89.5  --  86.6  --  85.6  --  87.2  --   --   --  92.6  PLT 26*  --  25*  --  25*  --  27*  --   --   --  14*   < > = values in this interval not displayed.    Basic Metabolic Panel: Recent Labs  Lab 19-Nov-2019 1308 11/19/2019 1308 Nov 19, 2019 2040 11-19-19 2040 11/08/19 0500 11/08/19 0500 11/08/19 1508 11/08/19 1705 11/08/19 2053 11/08/19 2232 11/09/19 0251  NA 133*   < > 132*   < > 135   < > 140 141 136 140 141  K 4.2   < > 3.7   <  > 3.3*   < > 3.2* 3.2* 3.5 3.6 4.2  CL 90*  --  96*  --  101  --   --   --  101  --  101  CO2 18*  --  19*  --  17*  --   --   --  14*  --  11*  GLUCOSE 119*  --  113*  --  95  --   --   --  118*  --  104*  BUN 56*  --  65*  --  66*  --   --   --  66*  --  66*  CREATININE 3.79*  --  3.15*  --  2.81*  --   --   --  2.51*  --  2.77*  CALCIUM 7.6*  --  6.9*  --  6.3*  --   --   --  6.4*  --  6.6*  MG 1.8  --   --   --   --   --   --   --  2.6*  --  2.4  PHOS 6.9*  --   --   --   --   --   --   --   --   --  6.5*   < > = values in this interval not displayed.   GFR: Estimated Creatinine Clearance: 30.3 mL/min (A) (by C-G formula based on SCr of 2.77 mg/dL (H)). Recent Labs  Lab 11/19/2019 1401 11/05/2019 1809 11/29/2019 2040 11/26/2019 2247 11/08/19 0500 11/08/19 1138 11/08/19 2224 11/09/19 0251  PROCALCITON  --   --   --   --   --  33.94  --  50.38  WBC 16.5*  --  18.7*  --  22.5*  --   --  36.8*  LATICACIDVEN  --  2.8*  --  2.0* 2.4*  --  >11.0*  --     Liver Function Tests: Recent Labs  Lab 11/23/2019 1308 11/18/2019 2040 11/09/19 0251  AST 434* 432* 413*  ALT 74* 69* PENDING  ALKPHOS 73 80 79  BILITOT 2.2* 2.0* 2.9*  PROT 6.7 5.7* 4.4*  ALBUMIN 2.2* 1.9* 1.7*   No results for input(s): LIPASE, AMYLASE in the last 168 hours. Recent Labs  Lab 11/08/19 1325  AMMONIA 50*    ABG    Component Value Date/Time   PHART 7.317 (L) 11/08/2019 2232   PCO2ART 23.1 (L) 11/08/2019 2232   PO2ART 98 11/08/2019 2232   HCO3 11.8 (L) 11/08/2019 2232   TCO2 12 (L) 11/08/2019 2232   ACIDBASEDEF 12.0 (H) 11/08/2019 2232   O2SAT 97.0 11/08/2019 2232     Coagulation Profile: Recent Labs  Lab 11/17/2019 1308  INR 1.5*    Cardiac Enzymes: No results for input(s): CKTOTAL, CKMB, CKMBINDEX, TROPONINI in the last 168 hours.  HbA1C: Hgb A1c MFr Bld  Date/Time Value Ref Range Status  11/18/2019 02:01 PM 5.7 (H) 4.8 - 5.6 % Final    Comment:    (NOTE) Pre diabetes:           5.7%-6.4%  Diabetes:              >6.4%  Glycemic control for   <7.0% adults with diabetes     CBG: Recent Labs  Lab 11/13/2019 1922 11/08/19 2011 11/08/19 2231 11/09/19 0255  GLUCAP 104* 117* 105* 108*    CRITICAL CARE Performed by: Lanier ClamGrace E Ashtyn Meland  Total critical care time: 55 minutes  Critical care time was exclusive of separately billable procedures and treating other patients. Critical care was necessary to treat or prevent imminent or life-threatening deterioration.  Critical care was time spent personally by me on the following activities: development of treatment plan with patient and/or surrogate as well as nursing, discussions with consultants, evaluation of patient's response to treatment, examination of patient, obtaining history from patient or surrogate, ordering and performing treatments and interventions, ordering and review of laboratory studies, ordering and review of radiographic studies, pulse  oximetry and re-evaluation of patient's condition.   Tessie Fass MSN, AGACNP-BC [facility] [phone] If no answer, [phone] 11/09/2019, 7:29 AM

## 2019-11-09 NOTE — Progress Notes (Signed)
[facility] notified regarding pt's platelets and creatnine in morning lab results. No signs of bleeding noted. Also, only 50 cc urine output, 200 cc in bladder per bladder scanner.

## 2019-11-09 NOTE — Progress Notes (Signed)
Progress Note  Patient Name: Karl Alvarado Date of Encounter: 11/09/2019  [facility] Cardiologist: Shelva Majestic, MD   Subjective   Unresponsive on vent  Inpatient Medications    Scheduled Meds:  chlorhexidine gluconate (MEDLINE KIT)  15 mL Mouth Rinse BID   Chlorhexidine Gluconate Cloth  6 each Topical Daily   docusate  100 mg Oral BID   feeding supplement (ENSURE ENLIVE)  237 mL Oral BID BM   folic acid  1 mg Intravenous Daily   hydrocortisone sod succinate (SOLU-CORTEF) inj  50 mg Intravenous Q6H   insulin aspart  1-3 Units Subcutaneous Q4H   mouth rinse  15 mL Mouth Rinse 10 times per day   multivitamin  15 mL Oral Daily   mupirocin ointment  1 application Nasal BID   pantoprazole (PROTONIX) IV  40 mg Intravenous Q24H   polyethylene glycol  17 g Oral Daily   sodium bicarbonate       sodium chloride flush  10-40 mL Intracatheter Q12H   sodium chloride flush  3 mL Intravenous Q12H   thiamine  100 mg Oral Daily   Or   thiamine  100 mg Intravenous Daily   Continuous Infusions:  sodium chloride     sodium chloride Stopped (11/08/19 0754)   sodium chloride     calcium gluconate     calcium gluconate in NaCl     fentaNYL infusion INTRAVENOUS 50 mcg/hr (11/09/19 0757)   norepinephrine (LEVOPHED) Adult infusion 60 mcg/min (11/09/19 0600)   sodium bicarbonate 150 mEq in dextrose 5% 1000 mL 100 mL/hr at 11/09/19 0600   [START ON Dec 06, 2019] vancomycin     vasopressin 0.03 Units/min (11/09/19 0600)   PRN Meds: sodium chloride, sodium chloride, acetaminophen, acetaminophen, fentaNYL, midazolam, midazolam, ondansetron (ZOFRAN) IV, sodium chloride flush, sodium chloride flush   Vital Signs    Vitals:   11/09/19 0700 11/09/19 0701 11/09/19 0800 11/09/19 0801  BP: (!) 62/35 (!) 89/64    Pulse: (!) 133  (!) 139   Resp: (!) 21 (!) 24 (!) 23   Temp: (!) 101.1 F (38.4 C) (!) 101.1 F (38.4 C) (!) 100.9 F (38.3 C)   TempSrc:      SpO2: 90%   100% 100%  Weight:      Height:        Intake/Output Summary (Last 24 hours) at 11/09/2019 0906 Last data filed at 11/09/2019 0630 Gross per 24 hour  Intake 5137.7 ml  Output 800 ml  Net 4337.7 ml   Last 3 Weights 11/09/2019 11/28/2019 02/24/2019  Weight (lbs) 160 lb 7.9 oz 160 lb 146 lb 9.6 oz  Weight (kg) 72.8 kg 72.576 kg 66.497 kg      Telemetry    Sinus tachy- Personally Reviewed  ECG    From yesterday- sinus tachy with inferolateral ST elevation. Q waves in septal leads. Personally Reviewed  Physical Exam   GEN: intubated. unresponsive Neck: No JVD or bruits Cardiac: RRR, no murmurs, rubs, or gallops.  Respiratory: Clear anteriorly GI: Soft,  non-distended. Bowel sound hypoactive/absent IABP in right femoral area without hematoma. MS: No edema; diffusely mottled.  Neuro:  unresponsive on vent.   Labs    High Sensitivity Troponin:   Recent Labs  Lab 11/03/2019 1308 11/26/2019 1629  TROPONINIHS >27,000* >27,000*      Chemistry Recent Labs  Lab 11/21/2019 1308 12/01/2019 1308 11/30/2019 2040 11/12/2019 2040 11/08/19 0500 11/08/19 1508 11/08/19 2053 11/08/19 2232 11/09/19 0251  NA 133*   < >  132*   < > 135   < > 136 140 141  K 4.2   < > 3.7   < > 3.3*   < > 3.5 3.6 4.2  CL 90*   < > 96*   < > 101  --  101  --  101  CO2 18*   < > 19*   < > 17*  --  14*  --  11*  GLUCOSE 119*   < > 113*   < > 95  --  118*  --  104*  BUN 56*   < > 65*   < > 66*  --  66*  --  66*  CREATININE 3.79*   < > 3.15*   < > 2.81*  --  2.51*  --  2.77*  CALCIUM 7.6*   < > 6.9*   < > 6.3*  --  6.4*  --  6.6*  PROT 6.7  --  5.7*  --   --   --   --   --  4.4*  ALBUMIN 2.2*  --  1.9*  --   --   --   --   --  1.7*  AST 434*  --  432*  --   --   --   --   --  413*  ALT 74*  --  69*  --   --   --   --   --  74*  ALKPHOS 73  --  80  --   --   --   --   --  79  BILITOT 2.2*  --  2.0*  --   --   --   --   --  2.9*  GFRNONAA 17*   < > 21*   < > 24*  --  27*  --  24*  GFRAA 19*   < > 24*   < > 28*  --   32*  --  28*  ANIONGAP 25*   < > 17*   < > 17*  --  21*  --  29*   < > = values in this interval not displayed.     Hematology Recent Labs  Lab 11/08/19 0500 11/08/19 0500 11/08/19 1508 11/08/19 2232 11/09/19 0251 11/09/19 0522 11/09/19 0826  WBC 22.5*  --   --   --  36.8* 33.3*  --   RBC 4.29  --   --   --  3.77* 3.79*  --   HGB 11.9*  --    < > 12.2* 10.7* 10.7*  --   HCT 37.4*  --    < > 36.0* 34.9* 34.8*  --   MCV 87.2  --   --   --  92.6 91.8  --   MCH 27.7  --   --   --  28.4 28.2  --   MCHC 31.8  --   --   --  30.7 30.7  --   RDW 20.3*  --   --   --  20.8* 20.8*  --   PLT 27*   < >  --   --  14* 12* 12*   < > = values in this interval not displayed.    BNP Recent Labs  Lab 11/25/2019 1308  BNP 609.8*     DDimer  Recent Labs  Lab 11/09/19 0826  DDIMER PENDING     Radiology    DG Abd 1 View  Result Date: 11/08/2019  CLINICAL DATA:  Intubation and central line placement.  OG tube. EXAM: ABDOMEN - 1 VIEW COMPARISON:  None. FINDINGS: Orogastric tube tip overlies the level of the proximal stomach, side port in the region of the gastroesophageal junction. Consider advancing to another 10 centimeters into the body of the stomach. Bowel gas pattern is nonobstructive. No evidence for free intraperitoneal air. IMPRESSION: Orogastric tube tip to the level of the proximal stomach. Consider advancing to another 10 centimeters into the body of the stomach. Electronically Signed   By: Nolon Nations M.D.   On: 11/08/2019 16:41   CT HEAD WO CONTRAST  Result Date: 11/08/2019 CLINICAL DATA:  Neuro deficit, acute stroke suspected, left leg weakness this morning EXAM: CT HEAD WITHOUT CONTRAST TECHNIQUE: Contiguous axial images were obtained from the base of the skull through the vertex without intravenous contrast. COMPARISON:  None. FINDINGS: Brain: There is minimal subarachnoid blood in a single precentral sulcus of the posterior left frontal lobe (series 3, image 23). No evidence  of acute infarction, hydrocephalus, extra-axial collection or mass lesion/mass effect. Mild periventricular white matter hypodensity. Vascular: No hyperdense vessel or unexpected calcification. Skull: Normal. Negative for fracture or focal lesion. Sinuses/Orbits: No acute finding. Other: None. IMPRESSION: 1. There is minimal subarachnoid blood in a single precentral sulcus of the posterior left frontal lobe. No other acute intracranial pathology. 2. There is no cortical hypodensity of the right hemisphere or other left-sided motor correlate to explain left leg weakness. Consider MRI to further evaluate for acute diffusion restricting infarction. 3. Small-vessel white matter disease. These results will be called to the ordering clinician or representative by the Radiologist Assistant, and communication documented in the PACS or [employer]. Electronically Signed   By: Eddie Candle M.D.   On: 11/08/2019 12:54   CARDIAC CATHETERIZATION  Result Date: 11/06/2019 Cardiogenic shock in this patient who presented with ostial LAD thrombus in the setting of profound thrombocytopenia with platelet counts at 25,000 and with associated anterolateral apical to the distal inferoapical severe hypocontractility with EF estimate at 35% by catheterization earlier today. VIP Swan-Ganz catheterization demonstrating low right heart pressures. Successful insertion of intra-aortic balloon pump.  Following initiation of Levophed at 5 mcg/min and with 1:2 pulsation, blood pressure is now augmenting to 875 systolically. Plan gentle hydration with low filling pressures. Follow-up laboratory has been sent in the Cath Lab to reassess renal function with AKI, severe thrombocytopenia, BNP as well as lactic acid.  CARDIAC CATHETERIZATION  Result Date: 11/19/2019  Dist LM to Prox LAD lesion is 80% stenosed.  LV end diastolic pressure is normal.  There is evidence for thrombosis involving the LAD ostium extending partially into the very  distal left main. There does not appear to be atherosclerotic stenosis but residual narrowing is 80%. There is brisk TIMI-3 flow. Normal left circumflex and dominant RCA. Moderately severe LV dysfunction with severe hypokinesis involving the anterolateral wall extending around the apex to the distal inferoapical segment. There is good contractility of the basal anterior wall and mid to basal inferior wall. EF estimate 35%. LVEDP 12 mmHg. Total contrast:  less than 30 cc RECOMMENDATION: During the procedure, the patient CBC came back which showed a platelet count of 27,000. Subsequent platelet count continue to confirm severe thrombocytopenia. In addition, laboratory at the completion of the procedure showed a creatinine of 3.79. Due to the patient's profound thrombocytopenia, he is not a candidate for current intervention or thrombectomy. A venous sheath was inserted at the end of the procedure to  allow for improved access if the patient's one IV fails. If continued hypotension develops, he may require inotropic support.   DG CHEST PORT 1 VIEW  Result Date: 11/08/2019 CLINICAL DATA:  Intubation and central line placement.  OG tube. EXAM: PORTABLE CHEST 1 VIEW COMPARISON:  11/08/2019 FINDINGS: Endotracheal tube is in place, tip 4.5 centimeters above the carina. Nasogastric tube has been placed, tip overlying the level of the proximal stomach, side port in the region of the gastroesophageal junction. Consider advancing orogastric tube 10 centimeters. Inferior approach Swan-Ganz catheter tip overlies the level of the RIGHT pulmonary artery. Heart size is normal. Lungs are clear. The no pneumothorax. Remote RIGHT rib fractures. IMPRESSION: 1. Lines and tubes as described. 2. Consider advancing orogastric tube 10 centimeters. 3. No evidence for acute cardiopulmonary abnormality. Electronically Signed   By: Nolon Nations M.D.   On: 11/08/2019 16:39   DG CHEST PORT 1 VIEW  Result Date: 11/08/2019 CLINICAL DATA:   Myocardial infarction EXAM: PORTABLE CHEST 1 VIEW COMPARISON:  11/17/2019 FINDINGS: Normal cardiac silhouette. Swan-Ganz catheter from a femoral approach with tip in the RIGHT main pulmonary artery. Intra-aortic balloon pump tip noted at the level of the transverse arch. No pulmonary edema.  No pneumothorax.  Remote LEFT rib fractures IMPRESSION: 1. No pulmonary edema or pneumothorax. 2. Support apparatus as above. Electronically Signed   By: Suzy Bouchard M.D.   On: 11/08/2019 14:01   DG Chest Port 1 View  Result Date: 11/20/2019 CLINICAL DATA:  Chest pain EXAM: PORTABLE CHEST 1 VIEW COMPARISON:  None. FINDINGS: The heart size and mediastinal contours are within normal limits. Both lungs are clear. Multiple chronic, callused fracture deformities of the ribs. IMPRESSION: No acute abnormality of the lungs in AP portable projection. Electronically Signed   By: Eddie Candle M.D.   On: 11/27/2019 13:38   ECHOCARDIOGRAM COMPLETE  Result Date: 11/08/2019    ECHOCARDIOGRAM REPORT   Patient Name:   Karl Alvarado Date of Exam: 11/08/2019 Medical Rec #:  [id number]     Height:       74.0 in Accession #:    [id number]    Weight:       160.0 lb Date of Birth:  09/18/1960     BSA:          1.977 m Patient Age:    41 years      BP:           94/77 mmHg Patient Gender: M             HR:           130 bpm. Exam Location:  Inpatient Procedure: 2D Echo, Cardiac Doppler, Color Doppler and Intracardiac            Opacification Agent Indications:    STEMI  History:        Patient has no prior history of Echocardiogram examinations.                 Acute MI; Risk Factors:Current Smoker. ETOH abuse.  Sonographer:    Clayton Lefort RDCS (AE) Referring Phys: [place] Comments: Technically challenging study due to limited acoustic windows, Technically difficult study due to poor echo windows and suboptimal parasternal window. IMPRESSIONS  1. Mobile mass on aortic valve. Not well visualized due to poor image quality,  however best seen in clip 10. The aortic valve was not well visualized. Aortic valve regurgitation is trivial. No aortic stenosis is present.  2. Left ventricular ejection fraction, by estimation, is 45 to 50%. The left ventricle has mildly decreased function. The left ventricle demonstrates regional wall motion abnormalities (see scoring diagram/findings for description). Left ventricular diastolic function could not be evaluated.  3. Right ventricular systolic function is normal. The right ventricular size is normal. Tricuspid regurgitation signal is inadequate for assessing PA pressure.  4. The mitral valve is grossly normal. Trivial mitral valve regurgitation. FINDINGS  Left Ventricle: Left ventricular ejection fraction, by estimation, is 45 to 50%. The left ventricle has mildly decreased function. The left ventricle demonstrates regional wall motion abnormalities. Definity contrast agent was given IV to delineate the left ventricular endocardial borders. Left ventricular diastolic function could not be evaluated.  LV Wall Scoring: The mid and distal anterior wall, mid and distal lateral wall, mid anterolateral segment, apical septal segment, and apical inferior segment are hypokinetic. Right Ventricle: The right ventricular size is normal. Right vetricular wall thickness was not assessed. Right ventricular systolic function is normal. Tricuspid regurgitation signal is inadequate for assessing PA pressure. Left Atrium: Left atrial size was not well visualized. Right Atrium: Right atrial size was not well visualized. Pericardium: A small pericardial effusion is present. Mitral Valve: The mitral valve is grossly normal. Trivial mitral valve regurgitation. Tricuspid Valve: The tricuspid valve is not well visualized. Tricuspid valve regurgitation is trivial. Aortic Valve: Mobile mass on aortic valve. Not well visualized due to poor image quality, however best seen in clip 10. The aortic valve was not well  visualized. Aortic valve regurgitation is trivial. No aortic stenosis is present. Aortic valve mean gradient measures 3.3 mmHg. Aortic valve peak gradient measures 5.9 mmHg. Aortic valve area, by VTI measures 2.61 cm. Pulmonic Valve: The pulmonic valve was not well visualized. Pulmonic valve regurgitation is not visualized. Aorta: The aortic root was not well visualized. IAS/Shunts: No atrial level shunt detected by color flow Doppler. Additional Comments: A venous catheter is visualized in the inferior vena cava.  LEFT VENTRICLE PLAX 2D LVOT diam:     2.10 cm LV SV:         40 LV SV Index:   20 LVOT Area:     3.46 cm  RIGHT VENTRICLE             IVC RV S prime:     13.50 cm/s  IVC diam: 1.50 cm TAPSE (M-mode): 1.8 cm LEFT ATRIUM             Index       RIGHT ATRIUM           Index LA Vol (A2C):   32.7 ml 16.54 ml/m RA Area:     13.00 cm LA Vol (A4C):   56.5 ml 28.58 ml/m RA Volume:   34.80 ml  17.60 ml/m LA Biplane Vol: 44.7 ml 22.61 ml/m  AORTIC VALVE AV Area (Vmax):    2.85 cm AV Area (Vmean):   2.65 cm AV Area (VTI):     2.61 cm AV Vmax:           121.33 cm/s AV Vmean:          88.800 cm/s AV VTI:            0.154 m AV Peak Grad:      5.9 mmHg AV Mean Grad:      3.3 mmHg LVOT Vmax:         99.67 cm/s LVOT Vmean:        68.000 cm/s LVOT VTI:  0.116 m LVOT/AV VTI ratio: 0.75  SHUNTS Systemic VTI:  0.12 m Systemic Diam: 2.10 cm Cherlynn Kaiser MD Electronically signed by Cherlynn Kaiser MD Signature Date/Time: 11/08/2019/5:46:43 PM    Final    US Abdomen Limited RUQ  Result Date: 11/08/2019 CLINICAL DATA:  Cirrhosis.  Inpatient. EXAM: ULTRASOUND ABDOMEN LIMITED RIGHT UPPER QUADRANT COMPARISON:  None. FINDINGS: Gallbladder: No gallstones or wall thickening visualized. No sonographic Murphy sign noted by sonographer. Common bile duct: Diameter: 5 mm Liver: Liver parenchyma is diffusely moderately echogenic. No liver masses, noting decreased sensitivity in the setting of an echogenic liver. Liver  surface appears finely irregular. Portal vein is patent on color Doppler imaging with normal direction of blood flow towards the liver. Other: None. IMPRESSION: 1. Finely irregular liver surface, compatible with reported history of cirrhosis. Diffusely moderately echogenic liver parenchyma is a nonspecific finding that could represent steatosis and/or fibrosis. No liver masses. 2. Normal gallbladder and bile ducts. Electronically Signed   By: Ilona Sorrel M.D.   On: 11/08/2019 11:57    Cardiac Studies   LEFT HEART CATH AND CORONARY ANGIOGRAPHY  Conclusion    Dist LM to Prox LAD lesion is 80% stenosed.  LV end diastolic pressure is normal.   There is evidence for thrombosis involving the LAD ostium extending partially into the very distal left main. There does not appear to be atherosclerotic stenosis but residual narrowing is 80%. There is brisk TIMI-3 flow.  Normal left circumflex and dominant RCA.  Moderately severe LV dysfunction with severe hypokinesis involving the anterolateral wall extending around the apex to the distal inferoapical segment. There is good contractility of the basal anterior wall and mid to basal inferior wall. EF estimate 35%. LVEDP 12 mmHg.  Total contrast:  less than 30 cc  RECOMMENDATION: During the procedure, the patient CBC came back which showed a platelet count of 27,000. Subsequent platelet count continue to confirm severe thrombocytopenia. In addition, laboratory at the completion of the procedure showed a creatinine of 3.79. Due to the patient's profound thrombocytopenia, he is not a candidate for current intervention or thrombectomy. A venous sheath was inserted at the end of the procedure to allow for improved access if the patient's one IV fails. If continued hypotension develops, he may require inotropic support.    IABP Insertion  RIGHT HEART CATH  Conclusion  Cardiogenic shock in this patient who presented with ostial LAD thrombus in the  setting of profound thrombocytopenia with platelet counts at 25,000 and with associated anterolateral apical to the distal inferoapical severe hypocontractility with EF estimate at 35% by catheterization earlier today.  VIP Swan-Ganz catheterization demonstrating low right heart pressures.  Successful insertion of intra-aortic balloon pump.  Following initiation of Levophed at 5 mcg/min and with 1:2 pulsation, blood pressure is now augmenting to 856 systolically.  Plan gentle hydration with low filling pressures.  Follow-up laboratory has been sent in the Cath Lab to reassess renal function with AKI, severe thrombocytopenia, BNP as well as lactic acid.   IMPRESSIONS    1. Mobile mass on aortic valve. Not well visualized due to poor image  quality, however best seen in clip 10. The aortic valve was not well  visualized. Aortic valve regurgitation is trivial. No aortic stenosis is  present.  2. Left ventricular ejection fraction, by estimation, is 45 to 50%. The  left ventricle has mildly decreased function. The left ventricle  demonstrates regional wall motion abnormalities (see scoring  diagram/findings for description). Left ventricular  diastolic function  could not be evaluated.  3. Right ventricular systolic function is normal. The right ventricular  size is normal. Tricuspid regurgitation signal is inadequate for assessing  PA pressure.  4. The mitral valve is grossly normal. Trivial mitral valve  regurgitation.   Patient Profile     59 y.o. male with history of HTN, DVT in Nov 2020, Etoh abuse presented with acute inferolateral STEMI  Assessment & Plan    1. Acute inferolateral STEMI. Emergent cardiac cath showed an oscillating thrombus in the ostial LAD with TIMI 3 flow. Otherwise coronaries are normal. PCI/thrombectomy not performed due to profound thrombocytopenia and ARF. Returned with mixed shock picture and IABP placed. Troponin > 27,000. Not a candidate for  intervention with persistent severe thrombocytopenia with plts 12K. Will hold all antiplatelet and antithrombotic therapy.   2. Shock with mixed cardiogenic and septic shock. Filling pressures surprisingly good at cath but output low. On high dose Levophed and vasopressin. Co ox 67% now with pressors and IABP support. Concern with increasing lactic acidosis that he may have ischemic bowel as contributing factor.   3. Staph aureus bacteremia. There is some concern of a mobile mass on AV on Echo but images are very limited. There is no AI. Not a candidate for TEE with thrombocytopenia. On Vanc IV  4. ARF creatinine improved from admission but still oliguric.   5. Severe metabolic acidosis secondary to #2.  6. Acute respiratory failure on vent. Per CCM.   The patient is critically ill with multiple organ systems failure and requires high complexity decision making for assessment and support, frequent evaluation and titration of therapies, application of advanced monitoring technologies and extensive interpretation of multiple databases. His prognosis is very poor. I have spoken to Dr Aundra Dubin and Advanced heart failure team will evaluate with [place].    For questions or updates, please contact [place] Please consult [email or web address] for contact info under        Signed, Peter Martinique, MD  11/09/2019, 9:06 AM

## 2019-11-09 NOTE — Progress Notes (Signed)
Dr. Mackie Pai at bedside, ordered repeat lactic in one hour and IABP 1:1 if pt tolerates.

## 2019-11-09 NOTE — Consult Note (Addendum)
Advanced Heart Failure Team Consult Note   Primary Physician: Martinique, Julie M, NP PCP-Cardiologist:  Shelva Majestic, MD  Reason for Consultation: Cardiogenic Shock   HPI:    Karl Alvarado is seen today for evaluation of Cardiogenic Shock at the request of Dr. Martinique, Cardiology.   59 y/o male w/ h/o ETOH abuse (drinks 1 pint of Vodka daily, per family report), tobacco abuse, h/o DVT and HTN, who was admitted 8/7 for acute inferolateral STEMI c/b cardiogenic shock. Hs troponin >27K.  Emergent cath showed an oscillating thrombus in the ostial LAD w/ TIMI 3 flow, otherwise normal coronaries w/ normal LCx and dominant RCA. LVG demonstrated severe LV dysfunction w/ severe hypokinesis involving the anterolateral wall extending around the apex to the distal inferoapical segment. Estimated EF ~35%. LVEDP 12.   Due to severe thrombocytopenia w/ Plt ct of 27K, no intervention or thrombectomy was performed.   RHC c/w cardiogenic shock. CI 2.1. IABP placed in cath lab. Started on Levophed and VP for BP support.   Developed evidence of septic shock w/ blood cultures growing staph aureus. PCT 55 Lactic acid > 11  Now felt to have mixed cardiogenic + septic shock. Lactic acid >11. WBC 37K. Febrile w/ mTemp 101.7. Bacteremic w/ blood cultures growing staph aureus. 2D echo shows ? Mobile mass on the aortic valve. Unable to get TEE w/ severe thrombocytopenia, plts now 12K.  He is currently on daptomycin + high pressor support w/ NE, VP and Neo. IABP at 1:1.  Co-ox 67% but repeat swan #s still low, CI 1.75, CO 3.4, SVR 1400, CVP 5. Currently in Afib, 130s.     Cardiac Studies   LEFT HEART CATH AND CORONARY ANGIOGRAPHY  Conclusion    Dist LM to Prox LAD lesion is 80% stenosed.  LV end diastolic pressure is normal.   There is evidence for thrombosis involving the LAD ostium extending partially into the very distal left main. There does not appear to be atherosclerotic stenosis but residual  narrowing is 80%. There is brisk TIMI-3 flow.  Normal left circumflex and dominant RCA.  Moderately severe LV dysfunction with severe hypokinesis involving the anterolateral wall extending around the apex to the distal inferoapical segment. There is good contractility of the basal anterior wall and mid to basal inferior wall. EF estimate 35%. LVEDP 12 mmHg.   RHC  Right Heart  Right Heart Pressures RA: A-wave 7, V wave 7; mean 6 RV: 29/9 PA: 25/14 PW: a 13, v 9; mean 11  O2 saturation in the aorta 99% and in the pulmonary artery 59%. By the Fick method, cardiac output 4.3 L/min and cardiac index 2.1 L/min/m.  PVR: 1.9 WU   Echo 11/08/19 IMPRESSIONS    1. Mobile mass on aortic valve. Not well visualized due to poor image  quality, however best seen in clip 10. The aortic valve was not well  visualized. Aortic valve regurgitation is trivial. No aortic stenosis is  present.  2. Left ventricular ejection fraction, by estimation, is 45 to 50%. The  left ventricle has mildly decreased function. The left ventricle  demonstrates regional wall motion abnormalities (see scoring  diagram/findings for description). Left ventricular  diastolic function could not be evaluated.  3. Right ventricular systolic function is normal. The right ventricular  size is normal. Tricuspid regurgitation signal is inadequate for assessing  PA pressure.  4. The mitral valve is grossly normal. Trivial mitral valve  regurgitation.   Review of Systems: [y] = yes, [ ] =  no   . General: Weight gain [ ]; Weight loss [ ]; Anorexia [ X]; Fatigue Valu.Nieves ]; Fever [ X]; Chills [ ]; Weakness [ ]  . Cardiac: Chest pain/pressure [ X]; Resting SOB [ ]; Exertional SOB [ ]; Orthopnea [ ]; Pedal Edema [ ]; Palpitations [ ]; Syncope [ ]; Presyncope [ ]; Paroxysmal nocturnal dyspnea[ ]  . Pulmonary: Cough [ ]; Wheezing[ ]; Hemoptysis[ ]; Sputum [ ]; Snoring [ ]  . GI: Vomiting[ ]; Dysphagia[ ]; Melena[ ]; Hematochezia [  ]; Heartburn[ ]; Abdominal pain [ ]; Constipation [ ]; Diarrhea [ ]; BRBPR [ ]  . GU: Hematuria[ ]; Dysuria [ ]; Nocturia[ ]  . Vascular: Pain in legs with walking [ ]; Pain in feet with lying flat [ ]; Non-healing sores [ ]; Stroke [ ]; TIA [ ]; Slurred speech [ ];  . Neuro: Headaches[ ]; Vertigo[ ]; Seizures[ ]; Paresthesias[ ];Blurred vision [ ]; Diplopia [ ]; Vision changes [ ]  . Ortho/Skin: Arthritis [ ]; Joint pain [ ]; Muscle pain [ ]; Joint swelling [ ]; Back Pain [ ]; Rash [ ]  . Psych: Depression[ ]; Anxiety[ ]  . Heme: Bleeding problems [ ]; Clotting disorders [ X]; Anemia [ X]  . Endocrine: Diabetes [ ]; Thyroid dysfunction[ ]  Home Medications Prior to Admission medications   Medication Sig Start Date End Date Taking? Authorizing Provider  apixaban (ELIQUIS) 5 MG TABS tablet Take 2 tablets (92m) twice daily for 7 days, then 1 tablet (571m twice daily Patient not taking: Reported on 11/03/2019 02/02/19   NaVarney BilesMD  apixaban (ELIQUIS) 5 MG TABS tablet Take 1 tablet (5 mg total) by mouth 2 (two) times daily. Patient not taking: Reported on 11/30/2019 02/24/19   DoOrson SlickMD  diphenhydramine-acetaminophen (TYLENOL PM) 25-500 MG TABS tablet Take 1 tablet by mouth at bedtime as needed (sleep). Patient not taking: Reported on 11/23/2019    [provider]    Past Medical History: Past Medical History:  Diagnosis Date  . Current smoker   . ETOH abuse   . Hypertension   . Seizures ([facility]    Past Surgical History: Past Surgical History:  Procedure Laterality Date  . APPENDECTOMY    . IABP INSERTION N/A 11/02/2019   Procedure: IABP Insertion;  Surgeon: KeTroy SineMD;  [facility];  Service: Cardiovascular;  Laterality: N/A;  . LEFT HEART CATH AND CORONARY ANGIOGRAPHY N/A 11/19/2019   Procedure: LEFT HEART CATH AND CORONARY ANGIOGRAPHY;  Surgeon: KeTroy SineMD;  [facility];  Service: Cardiovascular;  Laterality:  N/A;  . REPAIR OF ESOPHAGUS  2014  . RIGHT HEART CATH N/A 12/01/2019   Procedure: RIGHT HEART CATH;  Surgeon: KeTroy SineMD;  [facility];  Service: Cardiovascular;  Laterality: N/A;    Family History: Family History  Problem Relation Age of Onset  . Diabetes Mother   . Prostate cancer Father     Social History: Social History   Socioeconomic History  . Marital status: Divorced    Spouse name: Not on file  . Number of children: 2  . Years of education: Not on file  . Highest education level: Not on file  Occupational History  . Occupation: Unemployed  Tobacco Use  . Smoking status: Current Every Day Smoker    Packs/day: 0.00    Types: Cigars  . Smokeless tobacco: Never Used  Substance  and Sexual Activity  . Alcohol use: Yes    Comment: 1 pint per day.  . Drug use: No  . Sexual activity: Not on file  Other Topics Concern  . Not on file  Social History Narrative  . Not on file   Social Determinants of Health   Financial Resource Strain:   . Difficulty of Paying Living Expenses:   Food Insecurity:   . Worried About Charity fundraiser in the Last Year:   . Arboriculturist in the Last Year:   Transportation Needs:   . Film/video editor (Medical):   Marland Kitchen Lack of Transportation (Non-Medical):   Physical Activity:   . Days of Exercise per Week:   . Minutes of Exercise per Session:   Stress:   . Feeling of Stress :   Social Connections:   . Frequency of Communication with Friends and Family:   . Frequency of Social Gatherings with Friends and Family:   . Attends Religious Services:   . Active Member of Clubs or Organizations:   . Attends Archivist Meetings:   Marland Kitchen Marital Status:     Allergies:  Allergies  Allergen Reactions  . Penicillins Hives and Rash    Rash  Has patient had a PCN reaction causing immediate rash, facial/tongue/throat swelling, SOB or lightheadedness with hypotension: YES Has patient had a PCN reaction  causing severe rash involving mucus membranes or skin necrosis: NO Has patient had a PCN reaction that required hospitalizationNO Has patient had a PCN reaction occurring within the last 10 years: NO If all of the above answers are "NO", then may proceed with Cephalosporin use.    Objective:    Vital Signs:   Temp:  [98.1 F (36.7 C)-101.7 F (38.7 C)] 100.9 F (38.3 C) (08/09 0800) Pulse Rate:  [52-247] 139 (08/09 0800) Resp:  [16-36] 23 (08/09 0800) BP: (53-204)/(29-179) 89/64 (08/09 0701) SpO2:  [90 %-100 %] 100 % (08/09 0801) Arterial Line BP: (71-100)/(46-77) 96/77 (08/09 0800) FiO2 (%):  [40 %-100 %] 40 % (08/09 0801) Weight:  [72.8 kg] 72.8 kg (08/09 0304) Last BM Date: 11/05/19  Weight change: Filed Weights   11/28/2019 1319 11/09/19 0304  Weight: 72.6 kg 72.8 kg    Intake/Output:   Intake/Output Summary (Last 24 hours) at 11/09/2019 0930 Last data filed at 11/09/2019 0630 Gross per 24 hour  Intake 5137.7 ml  Output 800 ml  Net 4337.7 ml      Physical Exam    CVP 5  General:  Critically ill, intubated and sedated HEENT: + ETT normal Neck: supple. JVP . Carotids 2+ bilat; no bruits. No lymphadenopathy or thyromegaly appreciated. Cor: PMI nondisplaced. Regular rate & rhythm. No rubs, gallops or murmurs. Lungs: intubated and sedated  Abdomen: soft, nontender, nondistended. No hepatosplenomegaly. No bruits or masses. Good bowel sounds. Extremities: no cyanosis, clubbing, rash, edema + RFA IABP  Neuro: alert & orientedx3, cranial nerves grossly intact. moves all 4 extremities w/o difficulty. Affect pleasant   Telemetry   Atrial fibrillation 130s   EKG    Sinus tach 119, inferolateral STE (11/08/19)   Labs   Basic Metabolic Panel: Recent Labs  Lab 11/12/2019 1308 11/21/2019 1308 11/28/2019 2040 11/04/2019 2040 11/08/19 0500 11/08/19 0500 11/08/19 1508 11/08/19 1705 11/08/19 2053 11/08/19 2232 11/09/19 0251  NA 133*   < > 132*   < > 135   < > 140 141 136  140 141  K 4.2   < > 3.7   < >  3.3*   < > 3.2* 3.2* 3.5 3.6 4.2  CL 90*  --  96*  --  101  --   --   --  101  --  101  CO2 18*  --  19*  --  17*  --   --   --  14*  --  11*  GLUCOSE 119*  --  113*  --  95  --   --   --  118*  --  104*  BUN 56*  --  65*  --  66*  --   --   --  66*  --  66*  CREATININE 3.79*  --  3.15*  --  2.81*  --   --   --  2.51*  --  2.77*  CALCIUM 7.6*   < > 6.9*   < > 6.3*  --   --   --  6.4*  --  6.6*  MG 1.8  --   --   --   --   --   --   --  2.6*  --  2.4  PHOS 6.9*  --   --   --   --   --   --   --   --   --  6.5*   < > = values in this interval not displayed.    Liver Function Tests: Recent Labs  Lab 11/05/2019 1308 11/09/2019 2040 11/09/19 0251  AST 434* 432* 413*  ALT 74* 69* 74*  ALKPHOS 73 80 79  BILITOT 2.2* 2.0* 2.9*  PROT 6.7 5.7* 4.4*  ALBUMIN 2.2* 1.9* 1.7*   No results for input(s): LIPASE, AMYLASE in the last 168 hours. Recent Labs  Lab 11/08/19 1325  AMMONIA 50*    CBC: Recent Labs  Lab 12/01/2019 1401 11/11/2019 1401 11/03/2019 2040 11/30/2019 2040 11/08/19 0500 11/08/19 0500 11/08/19 1508 11/08/19 1705 11/08/19 2232 11/09/19 0251 11/09/19 0522 11/09/19 0826  WBC 16.5*  --  18.7*  --  22.5*  --   --   --   --  36.8* 33.3*  --   NEUTROABS  --   --  15.8*  --   --   --   --   --   --   --  29.3*  --   HGB 11.4*   < > 12.3*   < > 11.9*   < > 12.9* 11.6* 12.2* 10.7* 10.7*  --   HCT 34.9*   < > 37.6*   < > 37.4*   < > 38.0* 34.0* 36.0* 34.9* 34.8*  --   MCV 86.6  --  85.6  --  87.2  --   --   --   --  92.6 91.8  --   PLT 25*   < > 25*  --  27*  --   --   --   --  14* 12* 12*   < > = values in this interval not displayed.    Cardiac Enzymes: No results for input(s): CKTOTAL, CKMB, CKMBINDEX, TROPONINI in the last 168 hours.  BNP: BNP (last 3 results) Recent Labs    11/11/2019 1308  BNP 609.8*    ProBNP (last 3 results) No results for input(s): PROBNP in the last 8760 hours.   CBG: Recent Labs  Lab 11/19/2019 1922  11/08/19 2011 11/08/19 2231 11/09/19 0255  GLUCAP 104* 117* 105* 108*    Coagulation Studies: Recent Labs    11/12/2019 1308 11/09/19 0826  LABPROT  17.8* 31.6*  INR 1.5* 3.2*     Imaging   DG Abd 1 View  Result Date: 11/08/2019 CLINICAL DATA:  Intubation and central line placement.  OG tube. EXAM: ABDOMEN - 1 VIEW COMPARISON:  None. FINDINGS: Orogastric tube tip overlies the level of the proximal stomach, side port in the region of the gastroesophageal junction. Consider advancing to another 10 centimeters into the body of the stomach. Bowel gas pattern is nonobstructive. No evidence for free intraperitoneal air. IMPRESSION: Orogastric tube tip to the level of the proximal stomach. Consider advancing to another 10 centimeters into the body of the stomach. Electronically Signed   By: Nolon Nations M.D.   On: 11/08/2019 16:41   CT HEAD WO CONTRAST  Result Date: 11/08/2019 CLINICAL DATA:  Neuro deficit, acute stroke suspected, left leg weakness this morning EXAM: CT HEAD WITHOUT CONTRAST TECHNIQUE: Contiguous axial images were obtained from the base of the skull through the vertex without intravenous contrast. COMPARISON:  None. FINDINGS: Brain: There is minimal subarachnoid blood in a single precentral sulcus of the posterior left frontal lobe (series 3, image 23). No evidence of acute infarction, hydrocephalus, extra-axial collection or mass lesion/mass effect. Mild periventricular white matter hypodensity. Vascular: No hyperdense vessel or unexpected calcification. Skull: Normal. Negative for fracture or focal lesion. Sinuses/Orbits: No acute finding. Other: None. IMPRESSION: 1. There is minimal subarachnoid blood in a single precentral sulcus of the posterior left frontal lobe. No other acute intracranial pathology. 2. There is no cortical hypodensity of the right hemisphere or other left-sided motor correlate to explain left leg weakness. Consider MRI to further evaluate for acute diffusion  restricting infarction. 3. Small-vessel white matter disease. These results will be called to the ordering clinician or representative by the Radiologist Assistant, and communication documented in the PACS or [employer]. Electronically Signed   By: Eddie Candle M.D.   On: 11/08/2019 12:54   DG CHEST PORT 1 VIEW  Result Date: 11/08/2019 CLINICAL DATA:  Intubation and central line placement.  OG tube. EXAM: PORTABLE CHEST 1 VIEW COMPARISON:  11/08/2019 FINDINGS: Endotracheal tube is in place, tip 4.5 centimeters above the carina. Nasogastric tube has been placed, tip overlying the level of the proximal stomach, side port in the region of the gastroesophageal junction. Consider advancing orogastric tube 10 centimeters. Inferior approach Swan-Ganz catheter tip overlies the level of the RIGHT pulmonary artery. Heart size is normal. Lungs are clear. The no pneumothorax. Remote RIGHT rib fractures. IMPRESSION: 1. Lines and tubes as described. 2. Consider advancing orogastric tube 10 centimeters. 3. No evidence for acute cardiopulmonary abnormality. Electronically Signed   By: Nolon Nations M.D.   On: 11/08/2019 16:39   DG CHEST PORT 1 VIEW  Result Date: 11/08/2019 CLINICAL DATA:  Myocardial infarction EXAM: PORTABLE CHEST 1 VIEW COMPARISON:  11/13/2019 FINDINGS: Normal cardiac silhouette. Swan-Ganz catheter from a femoral approach with tip in the RIGHT main pulmonary artery. Intra-aortic balloon pump tip noted at the level of the transverse arch. No pulmonary edema.  No pneumothorax.  Remote LEFT rib fractures IMPRESSION: 1. No pulmonary edema or pneumothorax. 2. Support apparatus as above. Electronically Signed   By: Suzy Bouchard M.D.   On: 11/08/2019 14:01   ECHOCARDIOGRAM COMPLETE  Result Date: 11/08/2019    ECHOCARDIOGRAM REPORT   Patient Name:   Karl Alvarado Date of Exam: 11/08/2019 Medical Rec #:  [id number]     Height:       74.0 in Accession #:    [id number]  Weight:       160.0 lb Date of  Birth:  12-20-1960     BSA:          1.977 m Patient Age:    44 years      BP:           94/77 mmHg Patient Gender: M             HR:           130 bpm. Exam Location:  Inpatient Procedure: 2D Echo, Cardiac Doppler, Color Doppler and Intracardiac            Opacification Agent Indications:    STEMI  History:        Patient has no prior history of Echocardiogram examinations.                 Acute MI; Risk Factors:Current Smoker. ETOH abuse.  Sonographer:    Clayton Lefort RDCS (AE) Referring Phys: [place] Comments: Technically challenging study due to limited acoustic windows, Technically difficult study due to poor echo windows and suboptimal parasternal window. IMPRESSIONS  1. Mobile mass on aortic valve. Not well visualized due to poor image quality, however best seen in clip 10. The aortic valve was not well visualized. Aortic valve regurgitation is trivial. No aortic stenosis is present.  2. Left ventricular ejection fraction, by estimation, is 45 to 50%. The left ventricle has mildly decreased function. The left ventricle demonstrates regional wall motion abnormalities (see scoring diagram/findings for description). Left ventricular diastolic function could not be evaluated.  3. Right ventricular systolic function is normal. The right ventricular size is normal. Tricuspid regurgitation signal is inadequate for assessing PA pressure.  4. The mitral valve is grossly normal. Trivial mitral valve regurgitation. FINDINGS  Left Ventricle: Left ventricular ejection fraction, by estimation, is 45 to 50%. The left ventricle has mildly decreased function. The left ventricle demonstrates regional wall motion abnormalities. Definity contrast agent was given IV to delineate the left ventricular endocardial borders. Left ventricular diastolic function could not be evaluated.  LV Wall Scoring: The mid and distal anterior wall, mid and distal lateral wall, mid anterolateral segment, apical septal segment,  and apical inferior segment are hypokinetic. Right Ventricle: The right ventricular size is normal. Right vetricular wall thickness was not assessed. Right ventricular systolic function is normal. Tricuspid regurgitation signal is inadequate for assessing PA pressure. Left Atrium: Left atrial size was not well visualized. Right Atrium: Right atrial size was not well visualized. Pericardium: A small pericardial effusion is present. Mitral Valve: The mitral valve is grossly normal. Trivial mitral valve regurgitation. Tricuspid Valve: The tricuspid valve is not well visualized. Tricuspid valve regurgitation is trivial. Aortic Valve: Mobile mass on aortic valve. Not well visualized due to poor image quality, however best seen in clip 10. The aortic valve was not well visualized. Aortic valve regurgitation is trivial. No aortic stenosis is present. Aortic valve mean gradient measures 3.3 mmHg. Aortic valve peak gradient measures 5.9 mmHg. Aortic valve area, by VTI measures 2.61 cm. Pulmonic Valve: The pulmonic valve was not well visualized. Pulmonic valve regurgitation is not visualized. Aorta: The aortic root was not well visualized. IAS/Shunts: No atrial level shunt detected by color flow Doppler. Additional Comments: A venous catheter is visualized in the inferior vena cava.  LEFT VENTRICLE PLAX 2D LVOT diam:     2.10 cm LV SV:         40 LV SV Index:  20 LVOT Area:     3.46 cm  RIGHT VENTRICLE             IVC RV S prime:     13.50 cm/s  IVC diam: 1.50 cm TAPSE (M-mode): 1.8 cm LEFT ATRIUM             Index       RIGHT ATRIUM           Index LA Vol (A2C):   32.7 ml 16.54 ml/m RA Area:     13.00 cm LA Vol (A4C):   56.5 ml 28.58 ml/m RA Volume:   34.80 ml  17.60 ml/m LA Biplane Vol: 44.7 ml 22.61 ml/m  AORTIC VALVE AV Area (Vmax):    2.85 cm AV Area (Vmean):   2.65 cm AV Area (VTI):     2.61 cm AV Vmax:           121.33 cm/s AV Vmean:          88.800 cm/s AV VTI:            0.154 m AV Peak Grad:      5.9 mmHg  AV Mean Grad:      3.3 mmHg LVOT Vmax:         99.67 cm/s LVOT Vmean:        68.000 cm/s LVOT VTI:          0.116 m LVOT/AV VTI ratio: 0.75  SHUNTS Systemic VTI:  0.12 m Systemic Diam: 2.10 cm Cherlynn Kaiser MD Electronically signed by Cherlynn Kaiser MD Signature Date/Time: 11/08/2019/5:46:43 PM    Final    US Abdomen Limited RUQ  Result Date: 11/08/2019 CLINICAL DATA:  Cirrhosis.  Inpatient. EXAM: ULTRASOUND ABDOMEN LIMITED RIGHT UPPER QUADRANT COMPARISON:  None. FINDINGS: Gallbladder: No gallstones or wall thickening visualized. No sonographic Murphy sign noted by sonographer. Common bile duct: Diameter: 5 mm Liver: Liver parenchyma is diffusely moderately echogenic. No liver masses, noting decreased sensitivity in the setting of an echogenic liver. Liver surface appears finely irregular. Portal vein is patent on color Doppler imaging with normal direction of blood flow towards the liver. Other: None. IMPRESSION: 1. Finely irregular liver surface, compatible with reported history of cirrhosis. Diffusely moderately echogenic liver parenchyma is a nonspecific finding that could represent steatosis and/or fibrosis. No liver masses. 2. Normal gallbladder and bile ducts. Electronically Signed   By: Ilona Sorrel M.D.   On: 11/08/2019 11:57      Medications:     Current Medications: . amiodarone  150 mg Intravenous Once  . chlorhexidine gluconate (MEDLINE KIT)  15 mL Mouth Rinse BID  . Chlorhexidine Gluconate Cloth  6 each Topical Daily  . docusate  100 mg Oral BID  . feeding supplement (ENSURE ENLIVE)  237 mL Oral BID BM  . folic acid  1 mg Intravenous Daily  . hydrocortisone sod succinate (SOLU-CORTEF) inj  50 mg Intravenous Q6H  . insulin aspart  1-3 Units Subcutaneous Q4H  . mouth rinse  15 mL Mouth Rinse 10 times per day  . multivitamin  15 mL Oral Daily  . mupirocin ointment  1 application Nasal BID  . pantoprazole (PROTONIX) IV  40 mg Intravenous Q24H  . polyethylene glycol  17 g Oral Daily   . sodium bicarbonate      . sodium chloride flush  10-40 mL Intracatheter Q12H  . sodium chloride flush  3 mL Intravenous Q12H  . thiamine  100 mg Oral Daily   Or  .  thiamine  100 mg Intravenous Daily     Infusions: . sodium chloride    . sodium chloride Stopped (11/08/19 0754)  . sodium chloride    . amiodarone     Followed by  . amiodarone    . calcium gluconate 1,000 mg (11/09/19 0924)  . fentaNYL infusion INTRAVENOUS 50 mcg/hr (11/09/19 0757)  . norepinephrine (LEVOPHED) Adult infusion 60 mcg/min (11/09/19 0925)  . phenylephrine (NEO-SYNEPHRINE) Adult infusion    . sodium bicarbonate 150 mEq in dextrose 5% 1000 mL 100 mL/hr at 11/09/19 0600  . vasopressin 0.03 Units/min (11/09/19 0600)      Assessment/Plan   1. Mixed Cardiogenic + Septic Shock - Lactic Acid > 11, WBC 37K, mTemp 101.9 - Blood Cultures + for Staph Aureus  - CI 2.1 on RHC, now w/ IABP. Co-ox 67% but CI remains low at 1.75. CO 3.4  - Increase VP to 0.04 - Start Epi infusion and wean NE as able  - continue IABP at 1:1  - continue abx,  daptomycine for septic shock    2. Bacteremia - BC + SA - ? vegetation on aortic valve visualized on TTE - Not a candidate for TEE given severe thrombocytopenia  - continue IV antibiotics   3. ? Aortic Valve Vegetation - possible mobile AV mass on TTE. Not well visualized due to poor image quality. Only trivial AI.  - no plans for TEE currently given severe thrombocytopenia, however confirmation will not change management  - continue treatment of bacteremia   4. Acute Systolic Heart Failure - LVEF estimated ~35% on cath LVG 8/7, in the setting of likely embolic occulsion of LAD - LVEF 45-50% on f/u echo 8/8. RV normal  - continue IABP at 1:1 - w/ low CI on swan, start Epi infusion and wean NE as able to tolerate. Increase VP to 0.04 - CVP low at 5. Give 1L IVFs   5. Inferolateral STEMI  - Hs Troponin >27,000 - Emergent Cath 8/7 showed an oscillating thrombus  in the ostial LAD w/ TIMI 3 flow, otherwise normal coronaries w/ normal LCx and dominant RCA. No thrombectomy/ PCI given severe thrombocytopenia - suspect likely embolic from probable AV vegetation   6. Thrombocytopenia  - Initial plt ct 26K on admit, down to 12K today  - hgb 10.7  - DIC panel pending - likely due to severe illness/sepsis   - transfuse if < 10K   7. Acute Hypoxic Respiratory Failure - vent management per PCCM   8. Acute Renal Failure  - SCr 3.79 on admit, in the setting of mixed cardiogenic + septic shock  - SCr improving, down to 2.77 today  - making some urine - monitor closely   9. Afib/ SVT - HR currently in the 130s - monitor while on pressors - continue amio gtt 60/hr  - keep K > 4.0 and Mg >2.0  - no a/c given severe thrombocytopenia     Length of Stay: 2  Brittainy Simmons, PA-C  11/09/2019, 9:30 AM  Advanced Heart Failure Team Pager [phone] (M-F; 7a - 4p)  Please contact [facility] for night-coverage after hours (4p -7a ) and weekends on [email or web address]  Agree with above.   Difficult case. 59 y/o male with ETOH abuse. Presented with STEMI and developed severe staph sepsis with shock. Cath and echo films reviewed personally. Coronaries otherwise pristine except for shaggy thrombotic lesion in proximal LAD. Suspect initial trigger is likely staph aureus sepsis with possible embolic event to LAD. Now critically  ill with MSOF due to combination of septic and cardiogenic shock. On multiple pressors and IABP with ongoing hypotension and severe lactic acidosis. Course complicated by VDRF, ARF, severe thrombocytopenia.   Echo with EF ~45% no significant AI  General:  Critically ill. Sedated on vent  HEENT: normal +ETT Neck: supple. no JVD. Carotids 2+ bilat; no bruits. No lymphadenopathy or thryomegaly appreciated. Cor: PMI nondisplaced. Regular tachy  Lungs: coarse Abdomen: soft, nontender, nondistended. No hepatosplenomegaly. No bruits or masses. Good  bowel sounds. Extremities: Mottled cool. No edema. RFA IABP Neuro: intubated/sedated  He is critically ill due to a combination of septic and cardiogenic shock. Suspect possible embolic event to LAD. Can do TEE when clinically more stable. Continue to support with abx, IABP and will add EPI and given IVF (CVP 5).  No role for upgrading mechanical support at this point with septic component, low platelets and possible Aov vegetation.    Prognosis guarded. D/w mother at bedside.   CRITICAL CARE Performed by: Glori Bickers  Total critical care time: 35 minutes  Critical care time was exclusive of separately billable procedures and treating other patients.  Critical care was necessary to treat or prevent imminent or life-threatening deterioration.  Critical care was time spent personally by me (independent of midlevel providers or residents) on the following activities: development of treatment plan with patient and/or surrogate as well as nursing, discussions with consultants, evaluation of patient's response to treatment, examination of patient, obtaining history from patient or surrogate, ordering and performing treatments and interventions, ordering and review of laboratory studies, ordering and review of radiographic studies, pulse oximetry and re-evaluation of patient's condition.  Glori Bickers, MD  11:26 AM

## 2019-11-09 NOTE — Progress Notes (Signed)
Pharmacy Antibiotic Note  Karl Alvarado is a 59 y.o. 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 Also noted that patient is on IABP for a thrombus in the LAD.  Pharmacy has been consulted for daptomycin dosing secondary to AKI. SCr 2.51>>2.77 (Baseline <1). CrCl ~ 30 ml/min. Karl Alvarado received one dose of vancomycin last night ~ 1700.   Plan: Daptomycin 600 mg ( ~ 8 mg/kg) every 24 hours starting this evening  Baseline CK tomorrow morning and weekly on Tuesdays Monitor repeat blood cultures, clinical status, renal function   Height: 6\' 2"  (188 cm) Weight: 72.8 kg (160 lb 7.9 oz) IBW/kg (Calculated) : 82.2  Temp (24hrs), Avg:100.2 F (37.9 C), Min:98.1 F (36.7 C), Max:101.7 F (38.7 C)  Recent Labs  Lab 12/01/2019 1308 11/09/2019 1308 11/04/2019 1401 11/15/2019 1809 11/28/2019 2040 11/21/2019 2247 11/08/19 0500 11/08/19 2053 11/08/19 2224 11/09/19 0251 11/09/19 0522  WBC 16.8*   < > 16.5*  --  18.7*  --  22.5*  --   --  36.8* 33.3*  CREATININE 3.79*  --   --   --  3.15*  --  2.81* 2.51*  --  2.77*  --   LATICACIDVEN  --   --   --  2.8*  --  2.0* 2.4*  --  >11.0*  --  >11.0*   < > = values in this interval not displayed.    Estimated Creatinine Clearance: 30.3 mL/min (A) (by C-G formula based on SCr of 2.77 mg/dL (H)).    Allergies  Allergen Reactions  . Penicillins Hives and Rash    Rash  Has patient had a PCN reaction causing immediate rash, facial/tongue/throat swelling, SOB or lightheadedness with hypotension: YES Has patient had a PCN reaction causing severe rash involving mucus membranes or skin necrosis: NO Has patient had a PCN reaction that required hospitalizationNO Has patient had a PCN reaction occurring within the last 10 years: NO If all of the above answers are "NO", then may proceed with Cephalosporin use.      Thank you for allowing pharmacy to be a part of this patient's care.  01/09/20, PharmD,  BCPS, BCIDP Infectious Diseases Clinical Pharmacist Phone: [phone] 11/09/2019 9:52 AM

## 2019-11-09 NOTE — Progress Notes (Signed)
CRITICAL VALUE ALERT  Critical Value:  Calcium 6.1  Date & Time Notied:  8.9.2021 1815  Provider Notified: Buddy Duty  Orders Received/Actions taken: orders received for ionized calcium lab to be added on.

## 2019-11-09 NOTE — Progress Notes (Addendum)
PCCM Progress Note   Critically ill middle aged M intubated sedated, on multiple pressors and IABP  HR 74 BP 97/64 SpO2 100% RR 22 IABP 1:1  Arrived at bedside to discuss clinical updates with pt's mother.  After reflection over the severity of present critical illness, pt's mother has decided that if the patient sustained a cardiac arrest, no resuscitation should be pursued. We discussed Code Status formally and mother confirms decision of DNR.  We also discussed goals of care in event of worsening organ failure. Specifically, it is felt that the patient would not want dialysis (including short term CRRT) if indicated. If worsening organ failure, mother would like to consider transition to comfort care.      Goals of Care Shock with multisystem organ failure -septic shock - MRSA bacteremia. Cardiogenic shock -AKI Acute liver injury with coagulopathy, thrombocytopenia  -lactic acidosis  P -Limited Code (presently intubated) no CPR, DF/CV, ACLS -continue current pressor support, IABP, dapto, current medical efforts -if worsening organ failure, revisit GOC and possible transition to comfort care. Notably, pt would not want dialysis    Tessie Fass MSN, AGACNP-BC [facility] Pulmonary/Critical Care Medicine [phone] If no answer, [phone] 11/09/2019, 2:33 PM

## 2019-11-09 NOTE — Progress Notes (Signed)
eLink Physician-Brief Progress Note Patient Name: Karl Alvarado DOB: [birth year] MRN: [id number]   Date of Service  11/09/2019  HPI/Events of Note  AM labs reviewed.  In and out of afib-sinus tach. Fever.  Labs stable.  Critical condition.   eICU Interventions  Continue care. No bleeding. ID to follow up.  Transfuse platelet if < 10000. Follow CBC.         Ranee Gosselin 11/09/2019, 4:57 AM

## 2019-11-09 NOTE — Progress Notes (Signed)
   EPI now on board. Patient becoming progressively mottled on exam.   Lactate rechecked remains > 11.0.   Hemodynamics repeated. CI 1.8.   Will increase EPI to 3  D/w Family. They want limited code. No HD. Chart updated.   Prognosis increasingly grim.   Additional CCT = 35 mins  Arvilla Meres, MD  4:12 PM

## 2019-11-09 NOTE — Progress Notes (Signed)
eLink Physician-Brief Progress Note Patient Name: Karl Alvarado DOB: [birth year] MRN: [id number]   Date of Service  11/09/2019  HPI/Events of Note  Hypocalcemia - ionized Ca++ = 0.78.   eICU Interventions  Will replace Ca++.      Intervention Category Major Interventions: Electrolyte abnormality - evaluation and management  Karl Alvarado,Karl Alvarado 11/09/2019, 11:18 PM

## 2019-11-09 NOTE — Progress Notes (Signed)
Called to the bedside due to absent pulses by doppler in bilateral radial, ulnar, DP and popliteal arteries. On exam, patient with mottled upper and lower extremities bilaterally that are cold to the touch. He remains on three vasopressors (epi, vaso, levo) and IABP support. In review of his record and exam, his lack of pulses is likely due to profound vasoconstriction in the setting of multiorgan failure and shock. While he is certainly at risk for arterial occlusion, he has been deemed not a candidate for intervention to known LAD thrombus given his multiorgan failure and severe thrombocytopenia and, therefore, would not be a candidate for intervention for other potential arterial thrombi as well. Therefore, recommended continued supportive care and weaning of pressors as able. Will check a CXR to confirm appropriate IABP placement. His prognosis remains grim.  Feliberto Harts, MD Cardiology [place]

## 2019-11-10 ENCOUNTER — Inpatient Hospital Stay (HOSPITAL_COMMUNITY): Payer: Self-pay

## 2019-11-10 DIAGNOSIS — R570 Cardiogenic shock: Secondary | ICD-10-CM

## 2019-11-10 DIAGNOSIS — I2119 ST elevation (STEMI) myocardial infarction involving other coronary artery of inferior wall: Secondary | ICD-10-CM

## 2019-11-10 LAB — BPAM PLATELET PHERESIS
Blood Product Expiration Date: 202108092359
ISSUE DATE / TIME: 202108091155
Unit Type and Rh: 6200

## 2019-11-10 LAB — POCT I-STAT EG7
Acid-Base Excess: 0 mmol/L (ref 0.0–2.0)
Bicarbonate: 23.2 mmol/L (ref 20.0–28.0)
Calcium, Ion: 0.89 mmol/L — CL (ref 1.15–1.40)
HCT: 40 % (ref 39.0–52.0)
Hemoglobin: 13.6 g/dL (ref 13.0–17.0)
O2 Saturation: 59 %
Potassium: 3.8 mmol/L (ref 3.5–5.1)
Sodium: 134 mmol/L — ABNORMAL LOW (ref 135–145)
TCO2: 24 mmol/L (ref 22–32)
pCO2, Ven: 31 mmHg — ABNORMAL LOW (ref 44.0–60.0)
pH, Ven: 7.482 — ABNORMAL HIGH (ref 7.250–7.430)
pO2, Ven: 28 mmHg — CL (ref 32.0–45.0)

## 2019-11-10 LAB — BASIC METABOLIC PANEL
Anion gap: 24 — ABNORMAL HIGH (ref 5–15)
BUN: 75 mg/dL — ABNORMAL HIGH (ref 6–20)
CO2: 19 mmol/L — ABNORMAL LOW (ref 22–32)
Calcium: 6.4 mg/dL — CL (ref 8.9–10.3)
Chloride: 97 mmol/L — ABNORMAL LOW (ref 98–111)
Creatinine, Ser: 2.89 mg/dL — ABNORMAL HIGH (ref 0.61–1.24)
GFR calc Af Amer: 27 mL/min — ABNORMAL LOW (ref >60)
GFR calc non Af Amer: 23 mL/min — ABNORMAL LOW (ref >60)
Glucose, Bld: 216 mg/dL — ABNORMAL HIGH (ref 70–99)
Potassium: 4.4 mmol/L (ref 3.5–5.1)
Sodium: 140 mmol/L (ref 135–145)

## 2019-11-10 LAB — CBC
HCT: 33.1 % — ABNORMAL LOW (ref 39.0–52.0)
Hemoglobin: 10.9 g/dL — ABNORMAL LOW (ref 13.0–17.0)
MCH: 28.8 pg (ref 26.0–34.0)
MCHC: 32.9 g/dL (ref 30.0–36.0)
MCV: 87.6 fL (ref 80.0–100.0)
Platelets: 14 10*3/uL — CL (ref 150–400)
RBC: 3.78 MIL/uL — ABNORMAL LOW (ref 4.22–5.81)
RDW: 20.3 % — ABNORMAL HIGH (ref 11.5–15.5)
WBC: 34.2 10*3/uL — ABNORMAL HIGH (ref 4.0–10.5)
nRBC: 0.6 % — ABNORMAL HIGH (ref 0.0–0.2)

## 2019-11-10 LAB — HEPATIC FUNCTION PANEL
ALT: 118 U/L — ABNORMAL HIGH (ref 0–44)
AST: 683 U/L — ABNORMAL HIGH (ref 15–41)
Albumin: 1.3 g/dL — ABNORMAL LOW (ref 3.5–5.0)
Alkaline Phosphatase: 79 U/L (ref 38–126)
Bilirubin, Direct: 1.9 mg/dL — ABNORMAL HIGH (ref 0.0–0.2)
Indirect Bilirubin: 1 mg/dL — ABNORMAL HIGH (ref 0.3–0.9)
Total Bilirubin: 2.9 mg/dL — ABNORMAL HIGH (ref 0.3–1.2)
Total Protein: 4 g/dL — ABNORMAL LOW (ref 6.5–8.1)

## 2019-11-10 LAB — PREPARE PLATELET PHERESIS: Unit division: 0

## 2019-11-10 LAB — PHOSPHORUS: Phosphorus: 5.7 mg/dL — ABNORMAL HIGH (ref 2.5–4.6)

## 2019-11-10 LAB — MAGNESIUM: Magnesium: 2.1 mg/dL (ref 1.7–2.4)

## 2019-11-10 LAB — PROTIME-INR
INR: 4 — ABNORMAL HIGH (ref 0.8–1.2)
Prothrombin Time: 38 seconds — ABNORMAL HIGH (ref 11.4–15.2)

## 2019-11-10 LAB — GLUCOSE, CAPILLARY
Glucose-Capillary: 211 mg/dL — ABNORMAL HIGH (ref 70–99)
Glucose-Capillary: 196 mg/dL — ABNORMAL HIGH (ref 70–99)

## 2019-11-10 LAB — CK: Total CK: 6289 U/L — ABNORMAL HIGH (ref 49–397)

## 2019-11-10 LAB — CALCIUM, IONIZED: Calcium, Ionized, Serum: 3.3 mg/dL — ABNORMAL LOW (ref 4.5–5.6)

## 2019-11-10 LAB — PROCALCITONIN: Procalcitonin: 62.26 ng/mL

## 2019-11-10 MED ORDER — GLYCOPYRROLATE 1 MG PO TABS
1.0000 mg | ORAL_TABLET | ORAL | Status: DC | PRN
  Filled 2019-11-10: qty 1

## 2019-11-10 MED ORDER — GLYCOPYRROLATE 0.2 MG/ML IJ SOLN: 0.2000 mg | INTRAMUSCULAR | Status: DC | PRN

## 2019-11-10 MED ORDER — ACETAMINOPHEN 650 MG RE SUPP: 650.0000 mg | Freq: Four times a day (QID) | RECTAL | Status: DC | PRN

## 2019-11-10 MED ORDER — LACTULOSE 10 GM/15ML PO SOLN
20.0000 g | Freq: Two times a day (BID) | ORAL | Status: DC
  Administered 2019-11-10: 20 g
  Filled 2019-11-10: qty 30

## 2019-11-10 MED ORDER — POLYVINYL ALCOHOL 1.4 % OP SOLN
1.0000 [drp] | Freq: Four times a day (QID) | OPHTHALMIC | Status: DC | PRN
  Filled 2019-11-10: qty 15

## 2019-11-10 MED ORDER — FENTANYL 2500MCG IN NS 250ML (10MCG/ML) PREMIX INFUSION: 0.0000 ug/h | INTRAVENOUS | Status: DC

## 2019-11-10 MED ORDER — FENTANYL BOLUS VIA INFUSION
100.0000 ug | INTRAVENOUS | Status: DC | PRN
  Administered 2019-11-10: 100 ug via INTRAVENOUS
  Filled 2019-11-10: qty 100

## 2019-11-10 MED ORDER — FENTANYL CITRATE (PF) 100 MCG/2ML IJ SOLN: 50.0000 ug | INTRAMUSCULAR | Status: DC | PRN

## 2019-11-10 MED ORDER — CALCIUM GLUCONATE-NACL 1-0.675 GM/50ML-% IV SOLN
1.0000 g | Freq: Once | INTRAVENOUS | Status: AC
  Administered 2019-11-10: 1000 mg via INTRAVENOUS
  Filled 2019-11-10: qty 50

## 2019-11-10 MED ORDER — DIPHENHYDRAMINE HCL 50 MG/ML IJ SOLN: 25.0000 mg | INTRAMUSCULAR | Status: DC | PRN

## 2019-11-10 MED ORDER — DEXTROSE 5 % IV SOLN: INTRAVENOUS | Status: DC

## 2019-11-10 MED ORDER — ACETAMINOPHEN 325 MG PO TABS: 650.0000 mg | ORAL_TABLET | Freq: Four times a day (QID) | ORAL | Status: DC | PRN

## 2019-11-11 LAB — CULTURE, BLOOD (ROUTINE X 2): Special Requests: ADEQUATE

## 2019-11-11 LAB — GLUCOSE, CAPILLARY: Glucose-Capillary: 16 mg/dL — CL (ref 70–99)

## 2019-11-18 NOTE — Discharge Summary (Signed)
Advanced Heart Failure Death Summary  Death Summary   Patient ID: Karl Alvarado MRN: [id number], DOB/AGE: 57/18/1962 57 y.o. Admit date: Nov 18, 2019 Date Time of Death: 2019-11-21 at  2317/07/27    Primary Discharge Diagnoses:  1. Mixed Cardiogenic Shock + Septic Shock 2. Bacteremia with Staph Aureus Sepsis    + blood cx 3. 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 Inferolateral STEMI  HS Trop > 27000 5. Thrombocytopenia   Down to 14 K 6. Acute Hypoxic Respiratory Failure   Intubated on admit  7. Lactic Acidosis   Lactic Acid  Peaked >11 8. Transaminitis 9. Hypoalbuminemia  10. AKI  11. L Foot Numbness   Hospital Course:  Karl Alvarado was a 59 year old with a history of ETOH abuse, HTN, seizures, DVT, and tobacco abuse.   Presented with STEMI and developed severe staph sepsis with shock. Hypotensive on arrival. Taken urgently to the cath lab that showed  thrombotic lesion in proximal LAD.  He was not a candidate for intervention due to severe thrombocytopenia and inability to place on anticoagulant. Placed on pressors and IABP. Suspect initial trigger is likely staph aureus sepsis with possible embolic event to LAD. He continue to worsen with MSOF due to combination of septic and cardiogenic shock.   Developed evidence of septic shock w/ blood cultures growing staph aureus. PCT 55 Lactic acid > 11. 2D echo shows ? Mobile mass on the aortic valve. Unable to get TEE w/ severe thrombocytopenia, plts now 12K. ID consulted. Placed on antibiotics but continued to decline.  Had refractory shock despite multiple pressors and IABP with ongoing hypotension and severe lactic acidosis.  Due to thrombocytopenia upgrading mechanical support was not feasible.  Course complicated by VDRF, ARF, severe thrombocytopenia.  Family provided with ongoing updates and on 2019-11-21 he transitioned to comfort care. He passed on 11-21-2019 at 2317/07/27.    Significant Diagnostic Studies RHC/LHC 11/18/2019  RA: A-wave 7, V wave 7;  mean 6 RV: 29/9 PA: 25/14 PW: a 13, v 9; mean 11 O2 saturation in the aorta 99% and in the pulmonary artery 59%. By the Fick method, cardiac output 4.3 L/min and cardiac index 2.1 L/min/m. PVR: 1.9 WU  Dist LM to Prox LAD lesion is 80% stenosed.  LV end diastolic pressure is normal. There was  evidence for thrombosis involving the LAD ostium extending partially into the very distal left main. There does not appear to be atherosclerotic stenosis but residual narrowing is 80%. There is brisk TIMI-3 flow. Normal left circumflex and dominant RCA.Moderately severe LV dysfunction with severe hypokinesis involving the anterolateral wall extending around the apex to the distal inferoapical segment. There is good contractility of the basal anterior wall and mid to basal inferior wall. EF estimate 35%. LVEDP 12 mmHg.  ECHO 11/08/2019  1. Mobile mass on aortic valve. Not well visualized due to poor image  quality, however best seen in clip 10. The aortic valve was not well  visualized. Aortic valve regurgitation is trivial. No aortic stenosis is  present.  2. Left ventricular ejection fraction, by estimation, is 45 to 50%. The  left ventricle has mildly decreased function. The left ventricle  demonstrates regional wall motion abnormalities (see scoring  diagram/findings for description). Left ventricular  diastolic function could not be evaluated.  3. Right ventricular systolic function is normal. The right ventricular  size is normal. Tricuspid regurgitation signal is inadequate for assessing  PA pressure.  4. The mitral valve is grossly normal. Trivial mitral valve  regurgitation.  Consultations CCM   ID   Duration of Discharge Encounter: Greater than 35 minutes   Signed, Tonye Becket, NP 11/18/2019, 1:22 PM

## 2019-12-02 NOTE — Progress Notes (Signed)
Awaiting family for transition to comfort care. Called mom, Malachi Bonds, who states they have said goodbye during visit earlier today and give the ok to transition to comfort care.

## 2019-12-02 NOTE — Progress Notes (Signed)
ID Pharmacy Progress Note   ID team aware of elevated CK > 6,000. This is likely due to his ischemic leg. We will trend CK daily and continue dapto for now pending clinical course.   Sharin Mons, PharmD, BCPS, BCIDP Infectious Diseases Clinical Pharmacist Phone: [phone] 11/16/2019 10:42 AM

## 2019-12-02 NOTE — Progress Notes (Signed)
eLink Physician-Brief Progress Note Patient Name: Karl Alvarado DOB: 1960-12-29 MRN: [id number]   Date of Service  11/13/2019  HPI/Events of Note  Multple issues: 1. Ca++ = 6.3 which corrects to 8.56 (Low) given albumin = 1.3 and 2. Thrombocytopenia - Platelets = 14. Transfuse platelets for platelet count < 10 or if actively bleeding.   eICU Interventions  Plan: 1. Replace Ca++.     Intervention Category Major Interventions: Electrolyte abnormality - evaluation and management  Jaileen Janelle,Nadeem Eugene 11/15/2019, 5:42 AM

## 2019-12-02 NOTE — Progress Notes (Signed)
Advanced Heart Failure Rounding Note   Subjective:    Remains in profound shock on multiple pressors and IABP.  CO/CI 2.9/1.5  Lactic acid > 11 last night.  CK 6,289 PCT 62 WBC 34k  PLT 14k   Objective:   Weight Range:  Vital Signs:   Temp:  [96.4 F (35.8 C)-102.2 F (39 C)] 100.9 F (38.3 C) (08/10 0700) Pulse Rate:  [87-147] 92 (08/10 0700) Resp:  [13-24] 22 (08/10 0700) BP: (39-113)/(17-96) 39/17 (08/10 0600) SpO2:  [92 %-100 %] 100 % (08/10 0700) Arterial Line BP: (75-112)/(52-82) 94/64 (08/09 1600) FiO2 (%):  [40 %] 40 % (08/10 0243) Weight:  [80.7 kg] 80.7 kg (08/10 0515) Last BM Date: 11/05/19  Weight change: Filed Weights   11/22/2019 1319 11/09/19 0304 12/05/2019 0515  Weight: 72.6 kg 72.8 kg 80.7 kg    Intake/Output:   Intake/Output Summary (Last 24 hours) at 12/05/2019 0738 Last data filed at 12/05/19 0700 Gross per 24 hour  Intake 8182.1 ml  Output 684 ml  Net 7498.1 ml     Physical Exam: General:  Intubated sedated HEENT: normal +ETT Neck: supple. JVP to jaw . Carotids 2+ bilat; no bruits. No lymphadenopathy or thryomegaly appreciated. Cor: PMI nondisplaced. Regular tachy No rubs, gallops or murmurs. Lungs: clear Abdomen: soft, nontender, nondistended. No hepatosplenomegaly. No bruits or masses. Good bowel sounds. Extremities: cool diffusely mottled RFA IABP Neuro: intubated sedated   Telemetry: Sinus tach  Labs: Basic Metabolic Panel: Recent Labs  Lab 11/05/2019 1308 11/29/2019 1400 11/08/19 0500 11/08/19 1508 11/08/19 2053 11/08/19 2053 11/08/19 2232 11/09/19 0251 11/09/19 1653 11/09/19 2256 Dec 05, 2019 0316  NA 133*   < > 135   < > 136   < > 140 141 139 138 140  K 4.2   < > 3.3*   < > 3.5   < > 3.6 4.2 4.3 4.1 4.4  CL 90*   < > 101  --  101  --   --  101 100 97* 97*  CO2 18*   < > 17*  --  14*  --   --  11* 13*  --  19*  GLUCOSE 119*   < > 95  --  118*  --   --  104* 164* 192* 216*  BUN 56*   < > 66*  --  66*  --   --  66* 67*  60* 75*  CREATININE 3.79*   < > 2.81*  --  2.51*  --   --  2.77* 2.64* 2.80* 2.89*  CALCIUM 7.6*   < > 6.3*  --  6.4*   < >  --  6.6* 6.1*  --  6.4*  MG 1.8  --   --   --  2.6*  --   --  2.4  --   --  2.1  PHOS 6.9*  --   --   --   --   --   --  6.5*  --   --  5.7*   < > = values in this interval not displayed.    Liver Function Tests: Recent Labs  Lab 11/02/2019 1308 11/18/2019 2040 11/09/19 0251 11/09/19 1653 2019/12/05 0316  AST 434* 432* 413* 699* 683*  ALT 74* 69* 74* 136* 118*  ALKPHOS 73 80 79 81 79  BILITOT 2.2* 2.0* 2.9* 3.1* 2.9*  PROT 6.7 5.7* 4.4* 3.8* 4.0*  ALBUMIN 2.2* 1.9* 1.7* 1.3* 1.3*   No results for input(s): LIPASE, AMYLASE in  the last 168 hours. Recent Labs  Lab 11/08/19 1325  AMMONIA 50*    CBC: Recent Labs  Lab 11/12/2019 2040 11/19/2019 2040 11/08/19 0500 11/08/19 1508 11/09/19 0251 11/09/19 0522 11/09/19 0826 11/09/19 1653 11/09/19 2256 November 18, 2019 0316  WBC 18.7*   < > 22.5*  --  36.8* 33.3*  --  34.6*  --  34.2*  NEUTROABS 15.8*  --   --   --   --  29.3*  --   --   --   --   HGB 12.3*   < > 11.9*   < > 10.7* 10.7*  --  10.5* 11.6* 10.9*  HCT 37.6*   < > 37.4*   < > 34.9* 34.8*  --  33.0* 34.0* 33.1*  MCV 85.6   < > 87.2  --  92.6 91.8  --  88.2  --  87.6  PLT 25*   < > 27*  --  14* 12* 12* 24*  --  14*   < > = values in this interval not displayed.    Cardiac Enzymes: Recent Labs  Lab 11-18-2019 0316  CKTOTAL 6,289*    BNP: BNP (last 3 results) Recent Labs    11/09/2019 1308  BNP 609.8*    ProBNP (last 3 results) No results for input(s): PROBNP in the last 8760 hours.    Other results:  Imaging: DG Abd 1 View  Result Date: 11/08/2019 CLINICAL DATA:  Intubation and central line placement.  OG tube. EXAM: ABDOMEN - 1 VIEW COMPARISON:  None. FINDINGS: Orogastric tube tip overlies the level of the proximal stomach, side port in the region of the gastroesophageal junction. Consider advancing to another 10 centimeters into the body of  the stomach. Bowel gas pattern is nonobstructive. No evidence for free intraperitoneal air. IMPRESSION: Orogastric tube tip to the level of the proximal stomach. Consider advancing to another 10 centimeters into the body of the stomach. Electronically Signed   By: Nolon Nations M.D.   On: 11/08/2019 16:41   CT HEAD WO CONTRAST  Result Date: 11/08/2019 CLINICAL DATA:  Neuro deficit, acute stroke suspected, left leg weakness this morning EXAM: CT HEAD WITHOUT CONTRAST TECHNIQUE: Contiguous axial images were obtained from the base of the skull through the vertex without intravenous contrast. COMPARISON:  None. FINDINGS: Brain: There is minimal subarachnoid blood in a single precentral sulcus of the posterior left frontal lobe (series 3, image 23). No evidence of acute infarction, hydrocephalus, extra-axial collection or mass lesion/mass effect. Mild periventricular white matter hypodensity. Vascular: No hyperdense vessel or unexpected calcification. Skull: Normal. Negative for fracture or focal lesion. Sinuses/Orbits: No acute finding. Other: None. IMPRESSION: 1. There is minimal subarachnoid blood in a single precentral sulcus of the posterior left frontal lobe. No other acute intracranial pathology. 2. There is no cortical hypodensity of the right hemisphere or other left-sided motor correlate to explain left leg weakness. Consider MRI to further evaluate for acute diffusion restricting infarction. 3. Small-vessel white matter disease. These results will be called to the ordering clinician or representative by the Radiologist Assistant, and communication documented in the PACS or [employer]. Electronically Signed   By: Eddie Candle M.D.   On: 11/08/2019 12:54   DG CHEST PORT 1 VIEW  Result Date: 11/18/19 CLINICAL DATA:  Circulatory failure, intra-aortic balloon pump placement EXAM: PORTABLE CHEST 1 VIEW COMPARISON:  11/08/2019 FINDINGS: Endotracheal tube is seen 5 cm above the carina. Nasogastric  tube extends into the upper abdomen beyond the margin of  the examination. Inferiorly approaching Swan-Ganz catheter tip is seen within the right pulmonary artery. Left internal jugular central venous catheter tip noted within the superior cavoatrial junction. Intra-aortic balloon pump distal marker noted within the aortic knob, unchanged from prior examination. Lungs are clear. No pneumothorax or pleural effusion. Cardiac size within normal limits. Pulmonary vascularity normal. No acute bone abnormality. IMPRESSION: 1. No active disease. 2. Support lines and tubes, as above. 3. No pneumothorax or pleural effusion. Electronically Signed   By: Fidela Salisbury MD   On: 2019/11/14 00:16   DG CHEST PORT 1 VIEW  Result Date: 11/08/2019 CLINICAL DATA:  Intubation and central line placement.  OG tube. EXAM: PORTABLE CHEST 1 VIEW COMPARISON:  11/08/2019 FINDINGS: Endotracheal tube is in place, tip 4.5 centimeters above the carina. Nasogastric tube has been placed, tip overlying the level of the proximal stomach, side port in the region of the gastroesophageal junction. Consider advancing orogastric tube 10 centimeters. Inferior approach Swan-Ganz catheter tip overlies the level of the RIGHT pulmonary artery. Heart size is normal. Lungs are clear. The no pneumothorax. Remote RIGHT rib fractures. IMPRESSION: 1. Lines and tubes as described. 2. Consider advancing orogastric tube 10 centimeters. 3. No evidence for acute cardiopulmonary abnormality. Electronically Signed   By: Nolon Nations M.D.   On: 11/08/2019 16:39   DG CHEST PORT 1 VIEW  Result Date: 11/08/2019 CLINICAL DATA:  Myocardial infarction EXAM: PORTABLE CHEST 1 VIEW COMPARISON:  11/20/2019 FINDINGS: Normal cardiac silhouette. Swan-Ganz catheter from a femoral approach with tip in the RIGHT main pulmonary artery. Intra-aortic balloon pump tip noted at the level of the transverse arch. No pulmonary edema.  No pneumothorax.  Remote LEFT rib fractures  IMPRESSION: 1. No pulmonary edema or pneumothorax. 2. Support apparatus as above. Electronically Signed   By: Suzy Bouchard M.D.   On: 11/08/2019 14:01   ECHOCARDIOGRAM COMPLETE  Result Date: 11/08/2019    ECHOCARDIOGRAM REPORT   Patient Name:   Karl Alvarado Date of Exam: 11/08/2019 Medical Rec #:  [id number]     Height:       74.0 in Accession #:    [id number]    Weight:       160.0 lb Date of Birth:  1960-05-18     BSA:          1.977 m Patient Age:    59 years      BP:           94/77 mmHg Patient Gender: M             HR:           130 bpm. Exam Location:  Inpatient Procedure: 2D Echo, Cardiac Doppler, Color Doppler and Intracardiac            Opacification Agent Indications:    STEMI  History:        Patient has no prior history of Echocardiogram examinations.                 Acute MI; Risk Factors:Current Smoker. ETOH abuse.  Sonographer:    Clayton Lefort RDCS (AE) Referring Phys: [place] Comments: Technically challenging study due to limited acoustic windows, Technically difficult study due to poor echo windows and suboptimal parasternal window. IMPRESSIONS  1. Mobile mass on aortic valve. Not well visualized due to poor image quality, however best seen in clip 10. The aortic valve was not well visualized. Aortic valve regurgitation is trivial. No aortic stenosis is  present.  2. Left ventricular ejection fraction, by estimation, is 45 to 50%. The left ventricle has mildly decreased function. The left ventricle demonstrates regional wall motion abnormalities (see scoring diagram/findings for description). Left ventricular diastolic function could not be evaluated.  3. Right ventricular systolic function is normal. The right ventricular size is normal. Tricuspid regurgitation signal is inadequate for assessing PA pressure.  4. The mitral valve is grossly normal. Trivial mitral valve regurgitation. FINDINGS  Left Ventricle: Left ventricular ejection fraction, by estimation, is 45 to  50%. The left ventricle has mildly decreased function. The left ventricle demonstrates regional wall motion abnormalities. Definity contrast agent was given IV to delineate the left ventricular endocardial borders. Left ventricular diastolic function could not be evaluated.  LV Wall Scoring: The mid and distal anterior wall, mid and distal lateral wall, mid anterolateral segment, apical septal segment, and apical inferior segment are hypokinetic. Right Ventricle: The right ventricular size is normal. Right vetricular wall thickness was not assessed. Right ventricular systolic function is normal. Tricuspid regurgitation signal is inadequate for assessing PA pressure. Left Atrium: Left atrial size was not well visualized. Right Atrium: Right atrial size was not well visualized. Pericardium: A small pericardial effusion is present. Mitral Valve: The mitral valve is grossly normal. Trivial mitral valve regurgitation. Tricuspid Valve: The tricuspid valve is not well visualized. Tricuspid valve regurgitation is trivial. Aortic Valve: Mobile mass on aortic valve. Not well visualized due to poor image quality, however best seen in clip 10. The aortic valve was not well visualized. Aortic valve regurgitation is trivial. No aortic stenosis is present. Aortic valve mean gradient measures 3.3 mmHg. Aortic valve peak gradient measures 5.9 mmHg. Aortic valve area, by VTI measures 2.61 cm. Pulmonic Valve: The pulmonic valve was not well visualized. Pulmonic valve regurgitation is not visualized. Aorta: The aortic root was not well visualized. IAS/Shunts: No atrial level shunt detected by color flow Doppler. Additional Comments: A venous catheter is visualized in the inferior vena cava.  LEFT VENTRICLE PLAX 2D LVOT diam:     2.10 cm LV SV:         40 LV SV Index:   20 LVOT Area:     3.46 cm  RIGHT VENTRICLE             IVC RV S prime:     13.50 cm/s  IVC diam: 1.50 cm TAPSE (M-mode): 1.8 cm LEFT ATRIUM             Index        RIGHT ATRIUM           Index LA Vol (A2C):   32.7 ml 16.54 ml/m RA Area:     13.00 cm LA Vol (A4C):   56.5 ml 28.58 ml/m RA Volume:   34.80 ml  17.60 ml/m LA Biplane Vol: 44.7 ml 22.61 ml/m  AORTIC VALVE AV Area (Vmax):    2.85 cm AV Area (Vmean):   2.65 cm AV Area (VTI):     2.61 cm AV Vmax:           121.33 cm/s AV Vmean:          88.800 cm/s AV VTI:            0.154 m AV Peak Grad:      5.9 mmHg AV Mean Grad:      3.3 mmHg LVOT Vmax:         99.67 cm/s LVOT Vmean:        68.000  cm/s LVOT VTI:          0.116 m LVOT/AV VTI ratio: 0.75  SHUNTS Systemic VTI:  0.12 m Systemic Diam: 2.10 cm Cherlynn Kaiser MD Electronically signed by Cherlynn Kaiser MD Signature Date/Time: 11/08/2019/5:46:43 PM    Final    US Abdomen Limited RUQ  Result Date: 11/08/2019 CLINICAL DATA:  Cirrhosis.  Inpatient. EXAM: ULTRASOUND ABDOMEN LIMITED RIGHT UPPER QUADRANT COMPARISON:  None. FINDINGS: Gallbladder: No gallstones or wall thickening visualized. No sonographic Murphy sign noted by sonographer. Common bile duct: Diameter: 5 mm Liver: Liver parenchyma is diffusely moderately echogenic. No liver masses, noting decreased sensitivity in the setting of an echogenic liver. Liver surface appears finely irregular. Portal vein is patent on color Doppler imaging with normal direction of blood flow towards the liver. Other: None. IMPRESSION: 1. Finely irregular liver surface, compatible with reported history of cirrhosis. Diffusely moderately echogenic liver parenchyma is a nonspecific finding that could represent steatosis and/or fibrosis. No liver masses. 2. Normal gallbladder and bile ducts. Electronically Signed   By: Ilona Sorrel M.D.   On: 11/08/2019 11:57      Medications:     Scheduled Medications:  sodium chloride   Intravenous Once   chlorhexidine gluconate (MEDLINE KIT)  15 mL Mouth Rinse BID   Chlorhexidine Gluconate Cloth  6 each Topical Daily   docusate  100 mg Oral BID   folic acid  1 mg Intravenous Daily    hydrocortisone sod succinate (SOLU-CORTEF) inj  50 mg Intravenous Q6H   insulin aspart  1-3 Units Subcutaneous Q4H   mouth rinse  15 mL Mouth Rinse 10 times per day   multivitamin  15 mL Oral Daily   mupirocin ointment  1 application Nasal BID   pantoprazole (PROTONIX) IV  40 mg Intravenous Q24H   polyethylene glycol  17 g Oral Daily   sodium chloride flush  10-40 mL Intracatheter Q12H   sodium chloride flush  3 mL Intravenous Q12H   thiamine  100 mg Oral Daily   Or   thiamine  100 mg Intravenous Daily     Infusions:  sodium chloride     sodium chloride Stopped (11/08/19 0754)   sodium chloride     amiodarone 30 mg/hr (11-19-2019 0700)   DAPTOmycin (CUBICIN)  IV Stopped (11/09/19 2036)   epinephrine 4 mcg/min (November 19, 2019 0700)   fentaNYL infusion INTRAVENOUS 75 mcg/hr (Nov 19, 2019 0700)   norepinephrine (LEVOPHED) Adult infusion 34 mcg/min (19-Nov-2019 0700)   sodium bicarbonate 150 mEq in dextrose 5% 1000 mL 100 mL/hr at 11-19-2019 0700   vasopressin 0.04 Units/min (11-19-2019 0700)     PRN Medications:  sodium chloride, sodium chloride, acetaminophen, acetaminophen, fentaNYL, midazolam, midazolam, ondansetron (ZOFRAN) IV, sodium chloride flush, sodium chloride flush   Assessment/Plan:   1. Mixed Cardiogenic + Septic Shock - Blood Cultures + for Staph Aureus  - He remains in shock despite full support  - He is actively dying primarily from staph sepsis but definitely a component of cardiogenic shock as well.  - I have d/w Dr. Elsworth Soho and unfortunately I don't think he can survive the day. Will d/w family and recommend switch to comfort care   2. Bacteremia with overwhelming staph aureus sepsis - BC + SA - ? vegetation on aortic valve visualized on TTE - Not a candidate for TEE given severe thrombocytopenia  - on IV antibiotics per ID  3. ? Aortic Valve Vegetation  4. Inferolateral STEMI  - Hs Troponin >27,000 - Emergent Cath 8/7 showed an  oscillating  thrombus in the ostial LAD w/ TIMI 3 flow, otherwise normal coronaries w/ normal LCx and dominant RCA. No thrombectomy/ PCI given severe thrombocytopenia - suspect likely embolic from probable AV vegetation   5. Thrombocytopenia  - Initial plt ct 26K on admit, down to 14K today  - likely due to severe illness/sepsis    6. Acute Hypoxic Respiratory Failure - vent management per PCCM  -  7. Acute Renal Failure  - creatinine climbing back up today  8. Limited code - plan as above  CRITICAL CARE Performed by: Glori Bickers  Total critical care time: 35 minutes  Critical care time was exclusive of separately billable procedures and treating other patients.  Critical care was necessary to treat or prevent imminent or life-threatening deterioration.  Critical care was time spent personally by me (independent of midlevel providers or residents) on the following activities: development of treatment plan with patient and/or surrogate as well as nursing, discussions with consultants, evaluation of patient's response to treatment, examination of patient, obtaining history from patient or surrogate, ordering and performing treatments and interventions, ordering and review of laboratory studies, ordering and review of radiographic studies, pulse oximetry and re-evaluation of patient's condition.     Length of Stay: 3   Glori Bickers Dec 04, 2019, 7:38 AM  Advanced Heart Failure Team Pager [phone] (M-F; 7a - 4p)  Please contact [facility] for night-coverage after hours (4p -7a ) and weekends on [email or web address]

## 2019-12-02 NOTE — Progress Notes (Addendum)
CRITICAL VALUE STICKER  CRITICAL VALUE: Calcium 6.4   RECEIVER (on-site recipient of call): Eduard Roux RN  DATE & TIME NOTIFIED:  19-Nov-2019 [id number]  MESSENGER (representative from lab): Sallyanne Kuster  MD NOTIFIED: Dr. Wyatt Portela via Lennox Laity RN E-Line nurse  TIME OF NOTIFICATION:0510  RESPONSE: waiting for orders

## 2019-12-02 NOTE — Plan of Care (Signed)
  Problem: Pain Managment: Goal: General experience of comfort will improve Outcome: Progressing   Problem: Safety: Goal: Ability to remain free from injury will improve Outcome: Progressing   Problem: Education: Goal: Knowledge of General Education information will improve Description: Including pain rating scale, medication(s)/side effects and non-pharmacologic comfort measures Outcome: Not Progressing   Problem: Health Behavior/Discharge Planning: Goal: Ability to manage health-related needs will improve Outcome: Not Progressing   Problem: Clinical Measurements: Goal: Ability to maintain clinical measurements within normal limits will improve Outcome: Not Progressing Goal: Will remain free from infection Outcome: Not Progressing Goal: Diagnostic test results will improve Outcome: Not Progressing Goal: Respiratory complications will improve Outcome: Not Progressing Goal: Cardiovascular complication will be avoided Outcome: Not Progressing   Problem: Activity: Goal: Risk for activity intolerance will decrease Outcome: Not Progressing   Problem: Nutrition: Goal: Adequate nutrition will be maintained Outcome: Not Progressing   Problem: Coping: Goal: Level of anxiety will decrease Outcome: Not Progressing   Problem: Elimination: Goal: Will not experience complications related to bowel motility Outcome: Not Progressing Goal: Will not experience complications related to urinary retention Outcome: Not Progressing   Problem: Skin Integrity: Goal: Risk for impaired skin integrity will decrease Outcome: Not Progressing   Problem: Education: Goal: Understanding of CV disease, CV risk reduction, and recovery process will improve Outcome: Not Progressing Goal: Individualized Educational Video(s) Outcome: Not Progressing   Problem: Activity: Goal: Ability to return to baseline activity level will improve Outcome: Not Progressing   Problem: Cardiovascular: Goal:  Ability to achieve and maintain adequate cardiovascular perfusion will improve Outcome: Not Progressing Goal: Vascular access site(s) Level 0-1 will be maintained Outcome: Not Progressing   Problem: Health Behavior/Discharge Planning: Goal: Ability to safely manage health-related needs after discharge will improve Outcome: Not Progressing  Pt febrile, IABP, responds to noxious stimuli. Extremities cool and mottled. Doppler pulses to Right femoral and left popliteal, Cardiology aware. Will continue to monitor and support pt.

## 2019-12-02 NOTE — Progress Notes (Signed)
NAME:  Karl Alvarado, MRN:  [id number], DOB:  1961-03-26, LOS: 3 ADMISSION DATE:  2019-11-23, CONSULTATION DATE:  11/08/19 REFERRING MD:  Tresa Endo, CHIEF COMPLAINT:  Feels ill   Brief History   59 year old man with heavy etoh abuse history, htn, p/w inferolateral stemi from LAD thrombus unable to intervene due to AKI and severe thrombocytopenia.  IABP placed and patient brought to ICU.   Care complicated by development of sepsis.  History of present illness   59 year old man with heavy etoh abuse history, htn, p/w inferolateral stemi from LAD thrombus unable to intervene due to AKI and severe thrombocytopenia.  IABP placed last night due to low Bps after plt transfusion.   Today's care complicated by development of sepsis (fevers, rising white count) and new left sided weakness.  Currently c/o cough/congestion, chills.  Denies dysuria, N/V/D.  IABP at 1:2.  Plts remain low.  Confused.  Past Medical History  EtOH abuse Prior DVT not currently on [facility] Prior seizures presumed due to EtOH w/d Tobacco abuse Alcoholic malnutrition Question EtOH cirrhosis  Significant Hospital Events   8/7 admitted, LHC, IABP 8/8 fevers, MRSA bacteremia, worsening shock. PCCM and ID consulted  8/9 worsening thrombocytopenia, worsening shock   Consults:  PCCM ID  Procedures:  8/7 LHC, IABP  Significant Diagnostic Tests:  CXR hyperinflation CT H> no acute intracranial abnormality   Micro Data:  COVID neg Blood cx x 2 > MRSA  UA>>  Antimicrobials:  Vanc 8/8>> Zosyn 8/8>>   Interim history/subjective:  Mottled b/l LE's up to thighs with no dopplerable pulses. Remains on levo / vaso / epi. Family discussion yesterday and decision made to not perform CPR / ACLS in the event of arrest.  Also no CRRT / HD.  Objective   Blood pressure (!) 39/17, pulse 92, temperature (!) 100.9 F (38.3 C), resp. rate (!) 22, height 6\' 2"  (1.88 m), weight 80.7 kg, SpO2 100 %. PAP: (26-37)/(10-20) 29/13 CVP:  [0 mmHg-15  mmHg] 10 mmHg PCWP:  [14 mmHg] 14 mmHg CO:  [2.6 L/min-4 L/min] 2.9 L/min CI:  [1.4 L/min/m2-2 L/min/m2] 1.5 L/min/m2  Vent Mode: PRVC FiO2 (%):  [40 %] 40 % Set Rate:  [22 bmp] 22 bmp Vt Set:  [650 mL] 650 mL PEEP:  [5 cmH20] 5 cmH20 Plateau Pressure:  [17 cmH20-19 cmH20] 18 cmH20   Intake/Output Summary (Last 24 hours) at 11/16/2019 0758 Last data filed at 11/27/2019 0700 Gross per 24 hour  Intake 8182.1 ml  Output 684 ml  Net 7498.1 ml   Filed Weights   11/23/19 1319 11/09/19 0304 11/13/2019 0515  Weight: 72.6 kg 72.8 kg 80.7 kg    Examination: General: Adult male, critically ill. Neuro: Sedated, not responsive. HEENT: [place]/AT. Sclerae anicteric. ETT in place. Cardiovascular: IRIR with audible IABP, no M/R/G.  Lungs: Respirations even and unlabored.  CTA bilaterally, No W/R/R. Abdomen: BS x 4, soft, NT/ND.  Musculoskeletal: No gross deformities, no edema.  Skin: Cold to touch.  Mottled LE's up to thighs, bruised UE's.  Assessment & Plan:   Metabolic encephalopathy - primarily 2/2 sepsis.  Some component HE (ammonia 50). - Continue supportive care. - Lactulose.  Shock - combination septic and cardiogenic  MRSA Bacteremia - ID following, continue dapto - Pressors per cards. - TEE when able.   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  Now s/p IABP placement. - Continue IABP wean per cardiology. - Further management  per primary.  Atrial fib. Hx DVT, on eliquis PTA - on hold given thrombocytopenia. - Continue amio. - Continue to hold [facility].  Lactic acidosis - ongoing 2/2 poor perfusion in setting above. - Continue supportive care.  Thrombocytopenia - Presumed 2/2 above. No shistocytes on peripheral smear. No evidence of bleeding.   Coagulopathy - 2/2 above.  RUS [place] compatible with cirrhosis.  - Continue supportive care. - Trend plt, transfuse if < 10 or if bleeding, if needs subsequent invasive procedure.   AKI  - 2/2 above. - Continue bicarb gtt. - Follow BMP. - Family has stated that pt would not desire CRRT / HD.  Hypocalcemia - s/p repletion. -Monitor.  Hx EtOH, in mild w/d - No etoh for a week, on CIWA, thiamine folate etc.   Best practice:  Diet: per primary Pain/Anxiety/Delirium protocol (if indicated): fent gtt  VAP protocol (if indicated): n/a DVT prophylaxis: SCDs GI prophylaxis: PPI Glucose control: n/a Mobility: BR with IABP in place Code Status: Limited - no CPR / ACLS. Family Communication: Per primary Disposition:  ICU   CC time: 35 min.   Rutherford Guys, [place] [facility] 03-Dec-2019, 8:11 AM

## 2019-12-02 NOTE — Progress Notes (Signed)
Monitor shows asystole, no respirations noted, aline flat.   Pronounced by Eduard Roux RN and Sharlet Salina RN, time of death 08-07-2317, see strip on chart.   Mom, Malachi Bonds, called and updated.

## 2019-12-02 NOTE — Procedures (Signed)
Extubation Procedure Note  Patient Details:   Name: Karl Alvarado DOB: [birth year] MRN: [id number]   Airway Documentation:  Airway 8 mm (Active)  Secured at (cm) 25 cm 11-30-2019 2036  Measured From Lips 11/30/2019 2036  Secured Location Center 30-Nov-2019 2036  Secured By [employer] 11-30-19 2036  Tube Holder Repositioned Yes 11-30-2019 2036  Cuff Pressure (cm H2O) 28 cm H2O 2019-11-30 0753  Site Condition Dry 30-Nov-2019 2036   RT terminally extubated patient to RA per MD order. Evaluation  O2 sats: currently acceptable Complications: No apparent complications Patient did not tolerate procedure well. Bilateral Breath Sounds: Clear, Diminished   No  Letta Median 2019/11/30, 2250

## 2019-12-02 DEATH — deceased

## 2021-12-26 IMAGING — CT CT HEAD W/O CM
3 series · 17 of 40 positions shown, 19 images · non-contrast
Comparison: None.

CLINICAL DATA: Neuro deficit, acute stroke suspected, left leg
weakness this morning

EXAM:
CT HEAD WITHOUT CONTRAST
TECHNIQUE: Contiguous axial images were obtained from the base of the skull
through the vertex without intravenous contrast.

[Series 3: head without · axial · non-contrast · 0.45mm/px · z∈[-44,+76]mm · 7 of 33 slices shown, 9 images]
[im 5/33  brain]
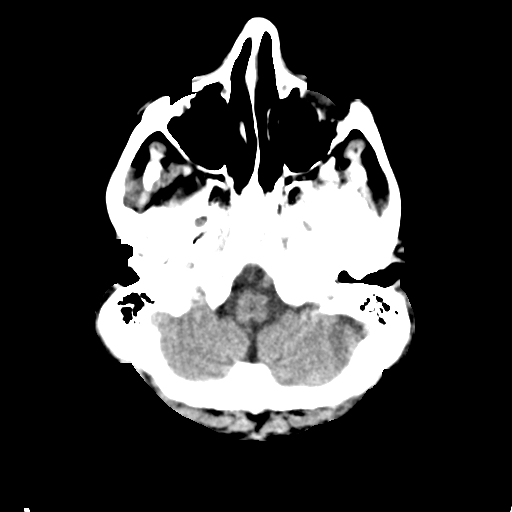
[im 5/33  bone]
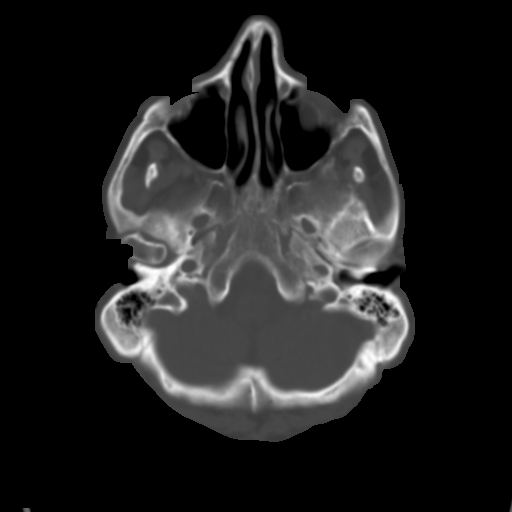
[im 9/33  brain]
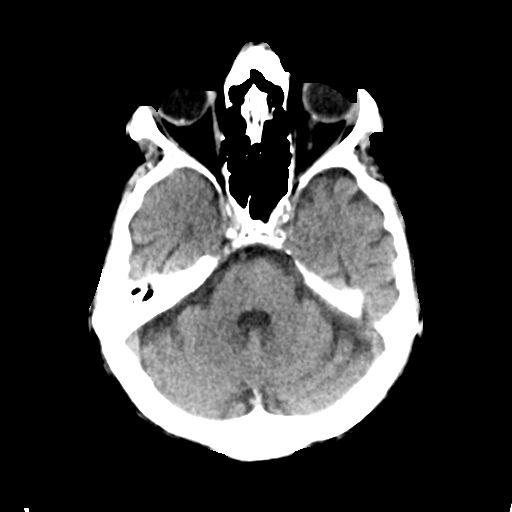
[im 13/33  brain]
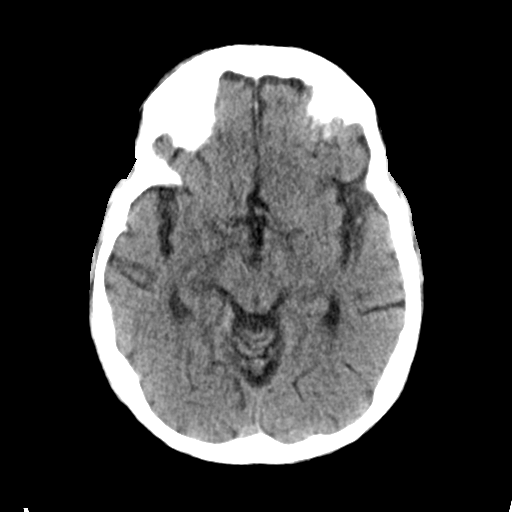
[im 17/33  brain]
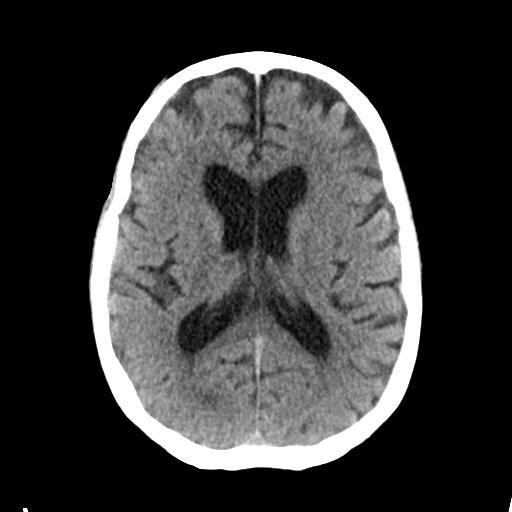
[im 21/33  brain]
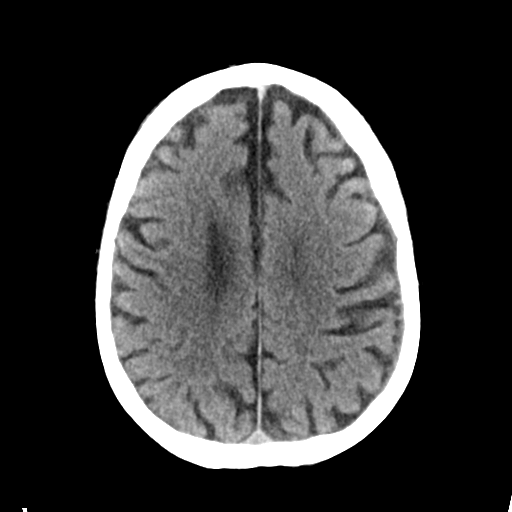
[im 21/33  bone]
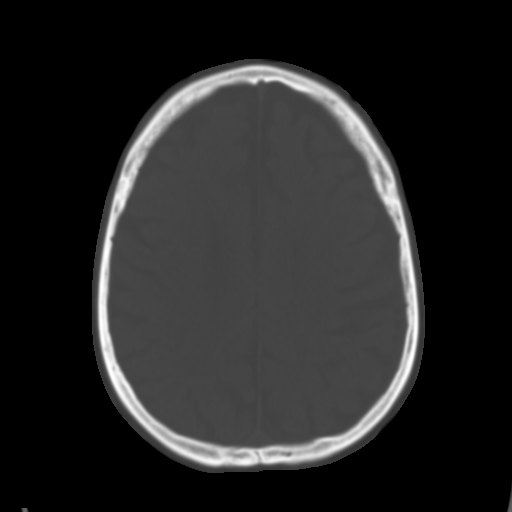
[im 25/33  brain]
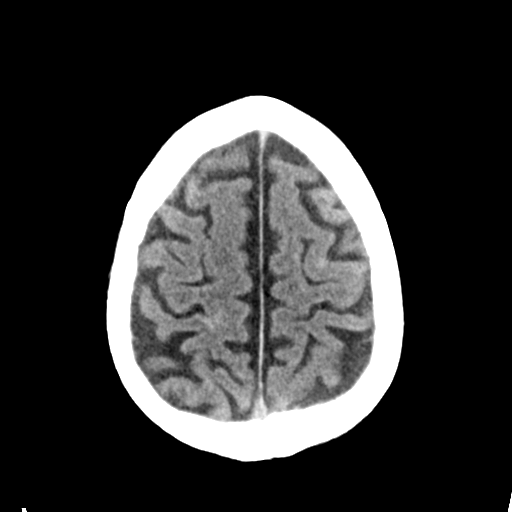
[im 29/33  brain]
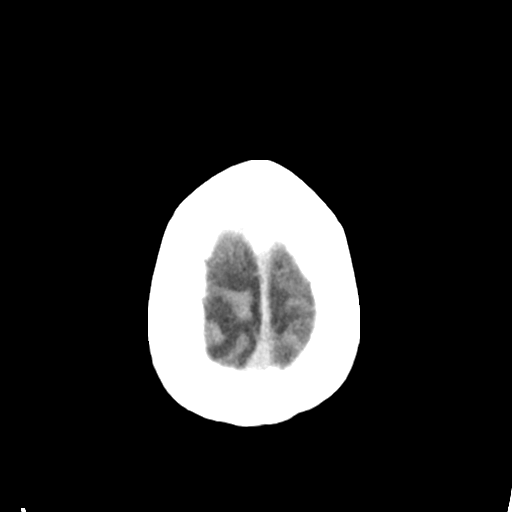

[Series 4: head bone · axial · 0.45mm/px · z∈[-48,+64]mm · 7 of 81 slices shown]
[im 9/81  bone]
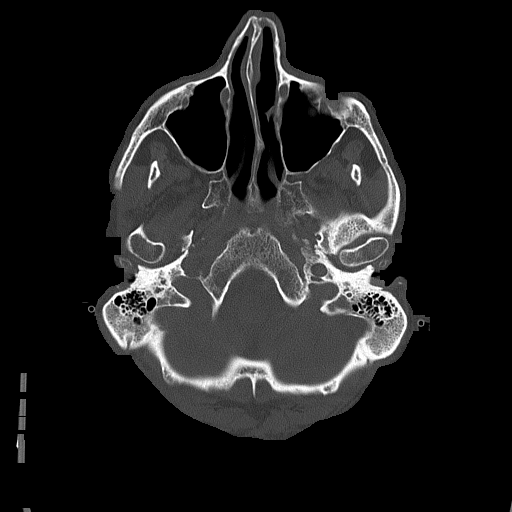
[im 17/81  bone]
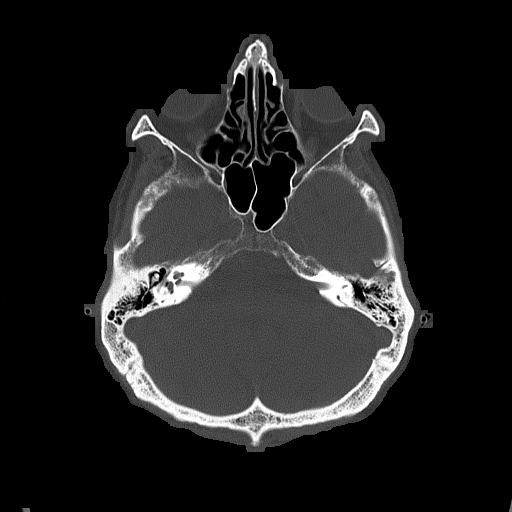
[im 25/81  bone]
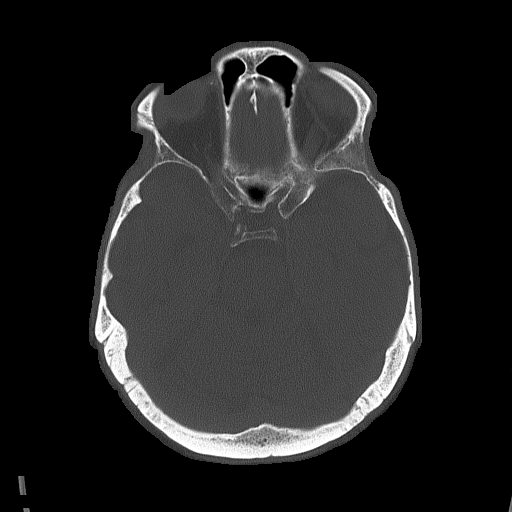
[im 37/81  bone]
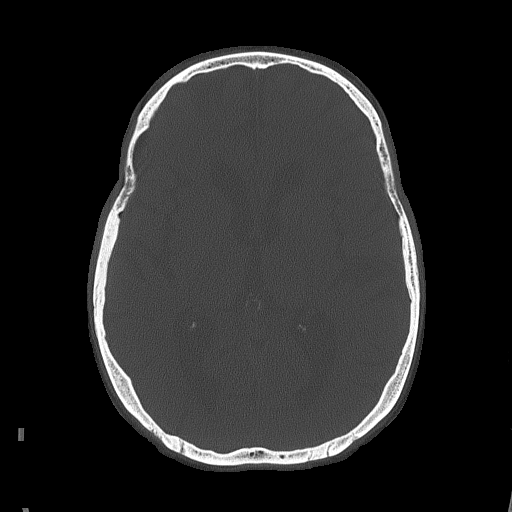
[im 45/81  bone]
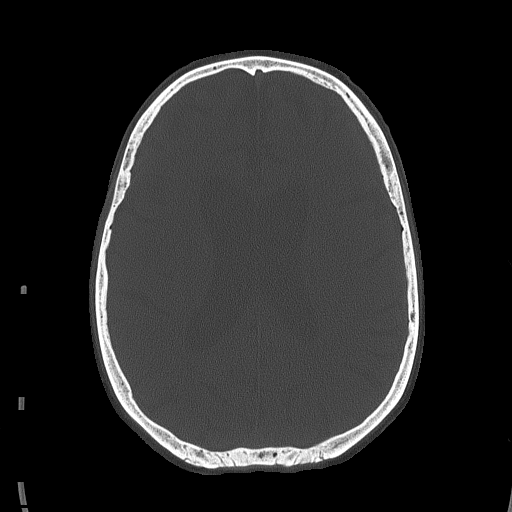
[im 57/81  bone]
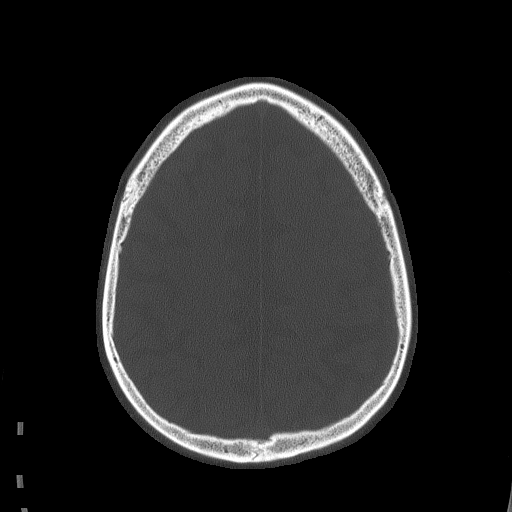
[im 65/81  bone]
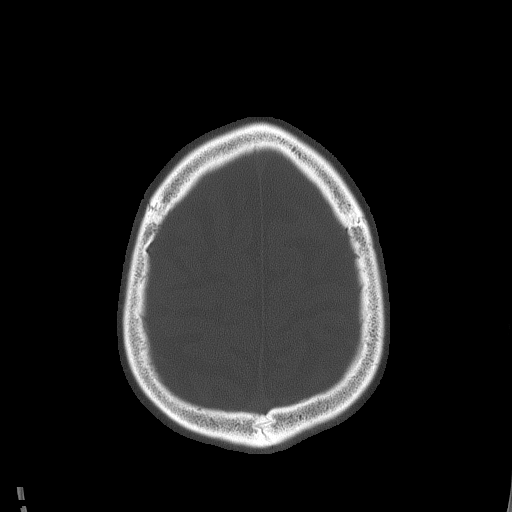

[Series 5: head without cor · coronal · non-contrast · 0.34mm/px · 3 of 68 slices shown]
[im 23/68  brain]
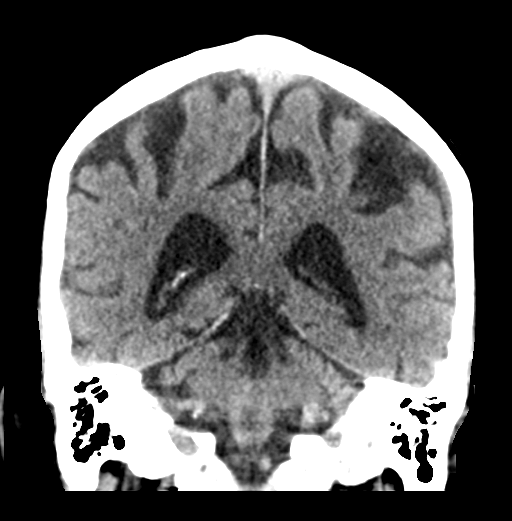
[im 30/68  brain]
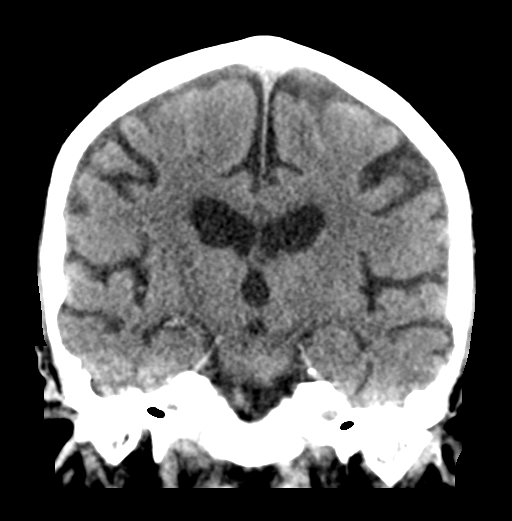
[im 38/68  brain]
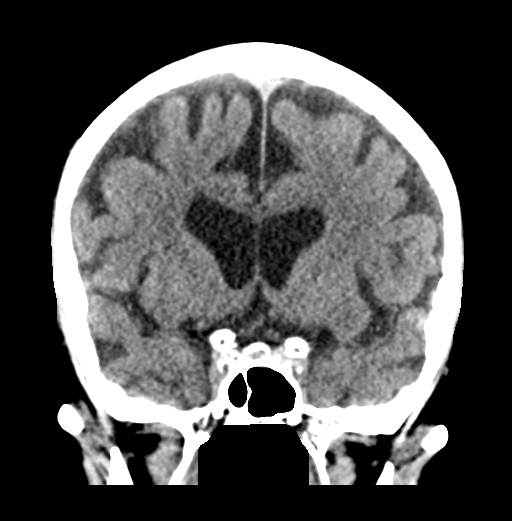

[17 of 40 positions shown; findings below may reference images not displayed]

FINDINGS: Brain: There is minimal subarachnoid blood in a single precentral
sulcus of the posterior left frontal lobe (series 3, image 23). No
evidence of acute infarction, hydrocephalus, extra-axial collection
or mass lesion/mass effect. Mild periventricular white matter
hypodensity.

Vascular: No hyperdense vessel or unexpected calcification.

Skull: Normal. Negative for fracture or focal lesion.

Sinuses/Orbits: No acute finding.

Other: None.
IMPRESSION: 1. There is minimal subarachnoid blood in a single precentral sulcus
of the posterior left frontal lobe. No other acute intracranial
pathology.
2. There is no cortical hypodensity of the right hemisphere or other
left-sided motor correlate to explain left leg weakness. Consider
MRI to further evaluate for acute diffusion restricting infarction.
3. Small-vessel white matter disease.

These results will be called to the ordering clinician or
representative by the Radiologist Assistant, and communication
documented in the PACS or [REDACTED].

## 2021-12-26 IMAGING — DX DG ABDOMEN 1V
1 series · 1 of 1 positions shown · non-contrast
Comparison: None.

CLINICAL DATA: Intubation and central line placement.  OG tube.

EXAM:
ABDOMEN - 1 VIEW

[abdomen]
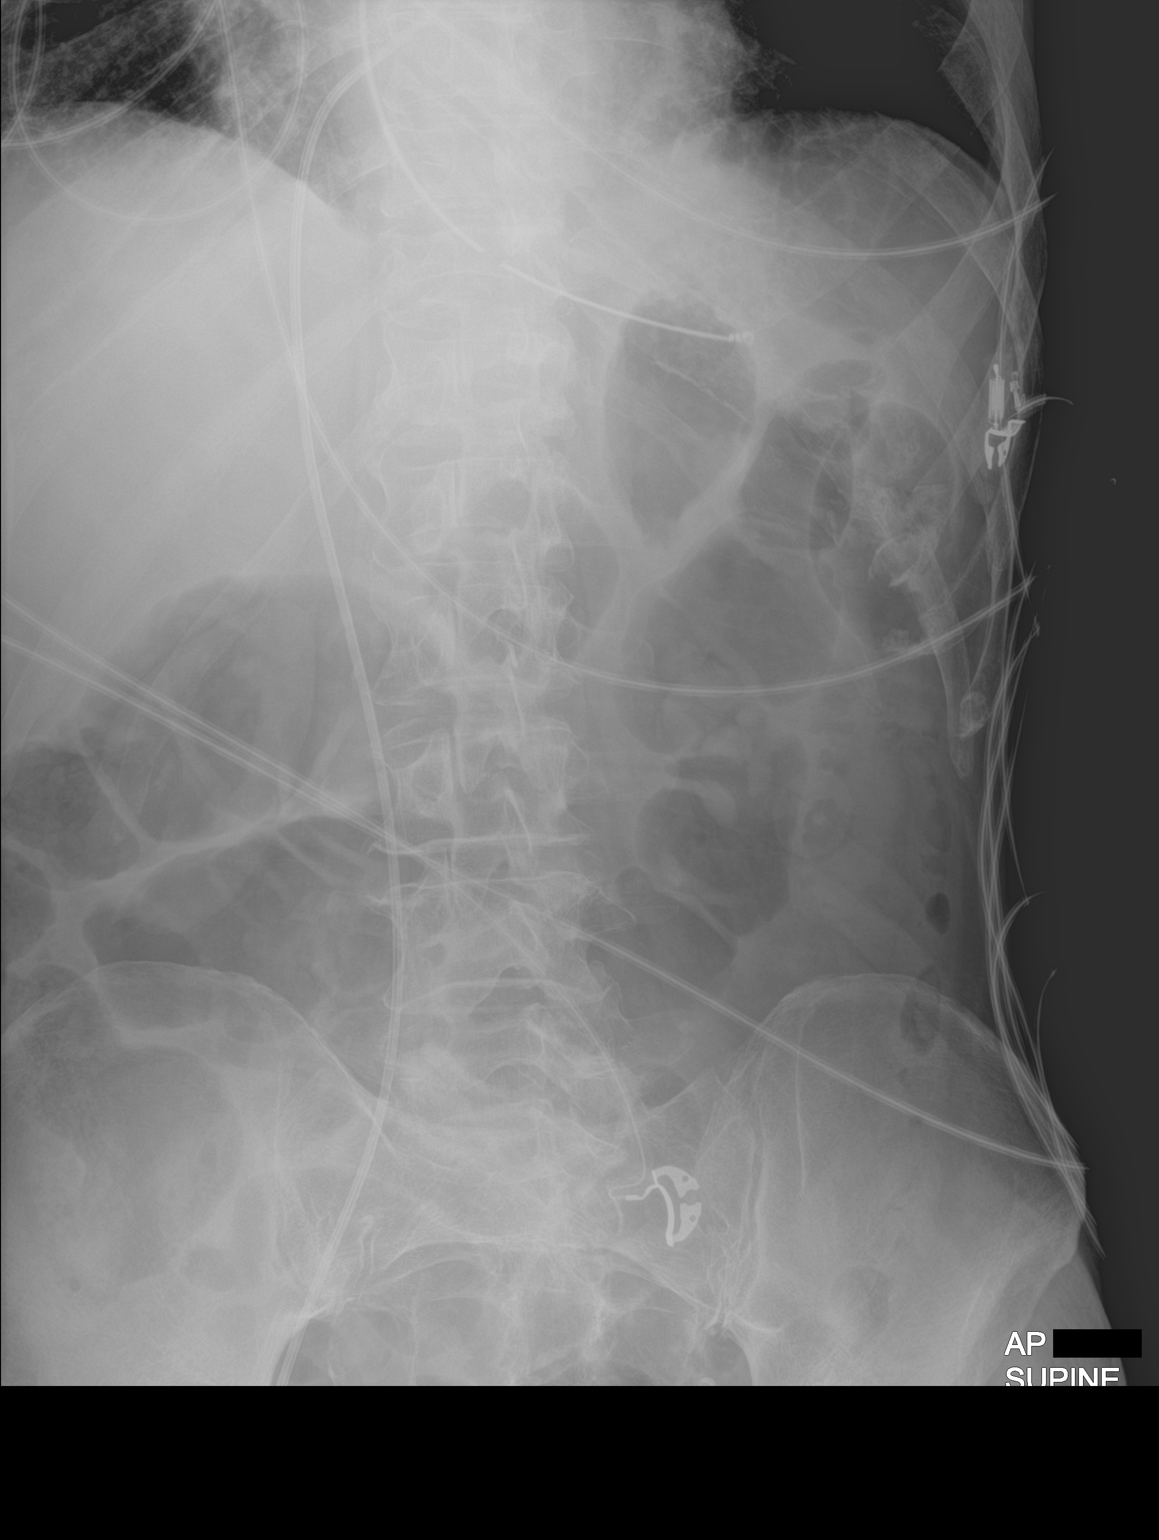

[1 of 1 positions shown; findings below may reference images not displayed]

FINDINGS: Orogastric tube tip overlies the level of the proximal stomach, side
port in the region of the gastroesophageal junction. Consider
advancing to another 10 centimeters into the body of the stomach.

Bowel gas pattern is nonobstructive. No evidence for free
intraperitoneal air.
IMPRESSION: Orogastric tube tip to the level of the proximal stomach. Consider
advancing to another 10 centimeters into the body of the stomach.
